# Patient Record
Sex: Male | Born: 1945 | Race: White | Hispanic: No | Marital: Married | Smoking: Former smoker
Health system: Southern US, Community
[De-identification: ages and names within clinical notes are randomized; demographics above are authoritative.]

## PROBLEM LIST (undated history)

## (undated) DIAGNOSIS — Z87442 Personal history of urinary calculi: Secondary | ICD-10-CM

## (undated) DIAGNOSIS — E538 Deficiency of other specified B group vitamins: Secondary | ICD-10-CM

## (undated) DIAGNOSIS — E119 Type 2 diabetes mellitus without complications: Secondary | ICD-10-CM

## (undated) DIAGNOSIS — I10 Essential (primary) hypertension: Secondary | ICD-10-CM

## (undated) DIAGNOSIS — M1990 Unspecified osteoarthritis, unspecified site: Secondary | ICD-10-CM

## (undated) DIAGNOSIS — E785 Hyperlipidemia, unspecified: Secondary | ICD-10-CM

## (undated) DIAGNOSIS — K219 Gastro-esophageal reflux disease without esophagitis: Secondary | ICD-10-CM

## (undated) DIAGNOSIS — Z8719 Personal history of other diseases of the digestive system: Secondary | ICD-10-CM

## (undated) DIAGNOSIS — C801 Malignant (primary) neoplasm, unspecified: Secondary | ICD-10-CM

## (undated) DIAGNOSIS — G4730 Sleep apnea, unspecified: Secondary | ICD-10-CM

## (undated) DIAGNOSIS — IMO0001 Reserved for inherently not codable concepts without codable children: Secondary | ICD-10-CM

## (undated) HISTORY — DX: Malignant (primary) neoplasm, unspecified: C80.1

## (undated) HISTORY — DX: Deficiency of other specified B group vitamins: E53.8

## (undated) HISTORY — DX: Essential (primary) hypertension: I10

## (undated) HISTORY — DX: Unspecified osteoarthritis, unspecified site: M19.90

## (undated) HISTORY — DX: Hyperlipidemia, unspecified: E78.5

## (undated) HISTORY — PX: LAPAROSCOPIC GASTRIC BANDING: SHX1100

## (undated) HISTORY — DX: Type 2 diabetes mellitus without complications: E11.9

## (undated) HISTORY — DX: Morbid (severe) obesity due to excess calories: E66.01

## (undated) HISTORY — PX: CATARACT EXTRACTION W/ INTRAOCULAR LENS  IMPLANT, BILATERAL: SHX1307

---

## 2002-04-06 ENCOUNTER — Encounter (INDEPENDENT_AMBULATORY_CARE_PROVIDER_SITE_OTHER): Payer: Self-pay | Admitting: *Deleted

## 2002-04-06 ENCOUNTER — Ambulatory Visit (HOSPITAL_COMMUNITY): Admission: RE | Admit: 2002-04-06 | Discharge: 2002-04-06 | Payer: Self-pay | Admitting: Gastroenterology

## 2005-04-05 ENCOUNTER — Ambulatory Visit: Payer: Self-pay | Admitting: Critical Care Medicine

## 2005-05-17 ENCOUNTER — Ambulatory Visit: Payer: Self-pay | Admitting: Critical Care Medicine

## 2007-02-20 LAB — HM COLONOSCOPY: HM COLON: NORMAL

## 2009-10-06 ENCOUNTER — Ambulatory Visit (HOSPITAL_COMMUNITY): Admission: RE | Admit: 2009-10-06 | Discharge: 2009-10-06 | Payer: Self-pay | Admitting: Surgery

## 2009-10-09 ENCOUNTER — Encounter: Admission: RE | Admit: 2009-10-09 | Discharge: 2009-12-10 | Payer: Self-pay | Admitting: Surgery

## 2009-10-17 ENCOUNTER — Ambulatory Visit (HOSPITAL_COMMUNITY): Admission: RE | Admit: 2009-10-17 | Discharge: 2009-10-17 | Payer: Self-pay | Admitting: Surgery

## 2010-01-15 ENCOUNTER — Encounter: Admission: RE | Admit: 2010-01-15 | Discharge: 2010-02-23 | Payer: Self-pay | Admitting: Surgery

## 2010-01-27 ENCOUNTER — Ambulatory Visit (HOSPITAL_COMMUNITY): Admission: RE | Admit: 2010-01-27 | Discharge: 2010-01-28 | Payer: Self-pay | Admitting: Surgery

## 2010-04-15 ENCOUNTER — Encounter: Admission: RE | Admit: 2010-04-15 | Discharge: 2010-04-15 | Payer: Self-pay | Admitting: Surgery

## 2010-07-24 ENCOUNTER — Encounter: Admission: RE | Admit: 2010-07-24 | Discharge: 2010-07-24 | Payer: Self-pay | Admitting: Surgery

## 2010-10-19 ENCOUNTER — Encounter
Admission: RE | Admit: 2010-10-19 | Discharge: 2010-12-22 | Payer: Self-pay | Source: Home / Self Care | Attending: Surgery | Admitting: Surgery

## 2011-01-13 ENCOUNTER — Other Ambulatory Visit: Payer: Self-pay | Admitting: Dermatology

## 2011-03-04 LAB — CBC
HCT: 41 % (ref 39.0–52.0)
Hemoglobin: 13.6 g/dL (ref 13.0–17.0)
MCV: 98.4 fL (ref 78.0–100.0)
MCV: 99.3 fL (ref 78.0–100.0)
Platelets: 131 10*3/uL — ABNORMAL LOW (ref 150–400)
Platelets: 183 10*3/uL (ref 150–400)
RBC: 3.95 MIL/uL — ABNORMAL LOW (ref 4.22–5.81)
RDW: 13.7 % (ref 11.5–15.5)

## 2011-03-04 LAB — DIFFERENTIAL
Basophils Absolute: 0 10*3/uL (ref 0.0–0.1)
Basophils Relative: 0 % (ref 0–1)
Basophils Relative: 1 % (ref 0–1)
Eosinophils Absolute: 0.1 10*3/uL (ref 0.0–0.7)
Eosinophils Absolute: 0.2 10*3/uL (ref 0.0–0.7)
Eosinophils Relative: 2 % (ref 0–5)
Lymphs Abs: 1.2 10*3/uL (ref 0.7–4.0)
Monocytes Absolute: 0.5 10*3/uL (ref 0.1–1.0)
Monocytes Absolute: 0.8 10*3/uL (ref 0.1–1.0)
Monocytes Relative: 8 % (ref 3–12)
Neutro Abs: 2.4 10*3/uL (ref 1.7–7.7)
Neutrophils Relative %: 52 % (ref 43–77)

## 2011-03-04 LAB — GLUCOSE, CAPILLARY
Glucose-Capillary: 111 mg/dL — ABNORMAL HIGH (ref 70–99)
Glucose-Capillary: 149 mg/dL — ABNORMAL HIGH (ref 70–99)
Glucose-Capillary: 152 mg/dL — ABNORMAL HIGH (ref 70–99)

## 2011-03-04 LAB — COMPREHENSIVE METABOLIC PANEL
ALT: 60 U/L — ABNORMAL HIGH (ref 0–53)
AST: 51 U/L — ABNORMAL HIGH (ref 0–37)
Albumin: 4.1 g/dL (ref 3.5–5.2)
CO2: 26 mEq/L (ref 19–32)
Chloride: 102 mEq/L (ref 96–112)
GFR calc non Af Amer: >60 mL/min (ref >60)
Total Protein: 7.3 g/dL (ref 6.0–8.3)

## 2011-04-27 ENCOUNTER — Encounter: Admit: 2011-04-27 | Payer: Self-pay | Admitting: Surgery

## 2011-04-27 ENCOUNTER — Encounter: Payer: BC Managed Care – PPO | Attending: Surgery | Admitting: *Deleted

## 2011-04-27 DIAGNOSIS — Z713 Dietary counseling and surveillance: Secondary | ICD-10-CM | POA: Insufficient documentation

## 2011-04-27 DIAGNOSIS — Z09 Encounter for follow-up examination after completed treatment for conditions other than malignant neoplasm: Secondary | ICD-10-CM | POA: Insufficient documentation

## 2011-04-27 DIAGNOSIS — Z9884 Bariatric surgery status: Secondary | ICD-10-CM | POA: Insufficient documentation

## 2011-04-30 NOTE — Op Note (Signed)
[place]  Patient:    John Bonilla, John Bonilla Visit Number: [id number] MRN: [id number]          Service Type: END Location: ENDO Attending Physician:  Orland Mustard Dictated by:   Llana Aliment. Randa Evens, M.D. Proc. Date: 04/06/02 Admit Date:  04/06/2002   CC:         [facility] [address], [place], [place] [address]                           Operative Report  DATE OF BIRTH:  Jun 09, 1946  PROCEDURE:  Colonoscopy and coagulation of polyps.  SURGEON:  James L. Edwards, M.D.  MEDICATIONS:  Fentanyl 100 mcg, Versed 10 mg IV.  SCOPE:  Olympus pediatric video colonoscope.  INDICATIONS:  Colon cancer screening.  DESCRIPTION OF PROCEDURE:  The procedure had been explained to the patient and consent obtained.  The patient was placed in the left lateral decubitus position.  The Olympus pediatric video colonoscope was inserted and advanced under direct visualization.  The prep was excellent.  We were able to reach the cecum without difficulty.  The scope was withdrawn and the cecum, ascending colon, hepatic flexure, transverse colon, splenic flexure, descending, and sigmoid colon were seen well upon removal.  No polyps or other lesions were seen.  The scope was withdrawn down in the rectum.  In the rectosigmoid area, three or four small sessile polyps were encountered.  They were cauterized and placed in a single jar.  The scope was withdrawn.  The patient tolerated the procedure well.  ASSESSMENT:  Small rectosigmoid polyps, cauterized.  PLAN:  Will recommend repeat colonoscopy if these are adenomatous.  We will await the pathology report to return before making this decision. Dictated by:   Llana Aliment. Randa Evens, M.D. Attending Physician:  Orland Mustard DD:  04/06/02 TD:  04/06/02 Job: [id number]

## 2012-09-01 ENCOUNTER — Telehealth (INDEPENDENT_AMBULATORY_CARE_PROVIDER_SITE_OTHER): Payer: Self-pay | Admitting: Surgery

## 2012-09-01 NOTE — Telephone Encounter (Signed)
I spoke with Verlon Au concerning the need for a follow-up visit for lap band surgery. The patient asked that I schedule an appt for his wife and himself on the same date/time frame. I scheduled his appt on 10/11/12 @ 2:40 pm....cef

## 2012-10-11 ENCOUNTER — Encounter (INDEPENDENT_AMBULATORY_CARE_PROVIDER_SITE_OTHER): Payer: Self-pay | Admitting: Surgery

## 2012-11-24 ENCOUNTER — Encounter (INDEPENDENT_AMBULATORY_CARE_PROVIDER_SITE_OTHER): Payer: Self-pay | Admitting: Surgery

## 2012-11-24 ENCOUNTER — Ambulatory Visit (INDEPENDENT_AMBULATORY_CARE_PROVIDER_SITE_OTHER): Payer: BC Managed Care – PPO | Admitting: Surgery

## 2012-11-24 DIAGNOSIS — Z9884 Bariatric surgery status: Secondary | ICD-10-CM

## 2012-11-24 NOTE — Patient Instructions (Signed)
Thanks for your patience.  If you need further assistance after leaving the office, please call our office and speak with a [facility] nurse.  [phone].  If you want to leave a message for Dr. Demetrica Zipp, please call his office phone at [phone]. 

## 2012-11-24 NOTE — Progress Notes (Signed)
Mr. And Mrs. Lemmons came in today for a lap band filled. I have not seen them in many years. In discussing their symptoms today are both restricted times and will spit up.  We reviewed times with most tightness in the morning. We then reviewed what they are eating. It is apparent that they are maladaptive eating. I explained how this can occur. I don't think that they need a filled today but I would not remove fluid either. I think they need an appointment with Sarah to review what they are reading. I have gone over the rationale for a low glycemic index food diet or high-protein diet. I would like to see him in 3 months and see how well third doing. Since Mr. Lemons just retired hopefully they can get to the [employer] and gets some exercise. Mrs. Lemmons has had bilateral knee replacements.  We had about a 30 minute talk and was utilized of information. I hope they are energized and will look at their dietary issues.    

## 2012-12-20 ENCOUNTER — Encounter: Payer: Self-pay | Admitting: *Deleted

## 2012-12-20 ENCOUNTER — Encounter: Payer: Medicare Other | Attending: Surgery | Admitting: *Deleted

## 2012-12-20 VITALS — Ht 69.0 in | Wt 283.1 lb

## 2012-12-20 DIAGNOSIS — Z9884 Bariatric surgery status: Secondary | ICD-10-CM | POA: Insufficient documentation

## 2012-12-20 DIAGNOSIS — E669 Obesity, unspecified: Secondary | ICD-10-CM

## 2012-12-20 DIAGNOSIS — Z09 Encounter for follow-up examination after completed treatment for conditions other than malignant neoplasm: Secondary | ICD-10-CM | POA: Insufficient documentation

## 2012-12-20 DIAGNOSIS — Z713 Dietary counseling and surveillance: Secondary | ICD-10-CM | POA: Insufficient documentation

## 2012-12-20 NOTE — Progress Notes (Signed)
  Follow-up visit:  ~ 3 Years Post-Operative LAGB Surgery  Medical Nutrition Therapy:  Appt start time: 0945 end time:  1045.  Primary concerns today: Post-operative Bariatric Surgery Nutrition Management. 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 Reports decreased stress now as he has retired from his job and wife's health has improved. Plans to eat out less and more at home. Reports he and his wife ate excessive CHO intake over the last few years.   Surgery type:  LAGB Surgery date:  01/27/2010 Start weight @ [facility]: 310.3 lbs Last weight: 254.1 lbs (04/27/11)   Weight today: 283.1 lbs Total weight lost: 27.2 lbs Goal weight: 200 lbs % goal met:  25%  24-hr recall: B (AM): 1 egg & 2 pcs regular bacon Snk (AM): NONE  L (PM): Ham & bean soup; baby sips of sweet tea (previously all fast food) Snk (PM): NONE  D (PM): "The Big Meal" - 1/3 chicken biscuit box - chicken, gravy, potatoes, biscuit all mixed together; baby sips of sweet tea Snk (PM): Ice cream or 1/2 bag kettle chips  Fluid intake: < 64 oz Estimated total protein intake: 60-70g  Medications: See medication list.  Supplementation: No calcium d/t h/o kidney stones. Advised to contact MD about resuming.  CBG monitoring: None Average CBG per patient: N/A Last patient reported A1c: Unknown  Using straws: No Drinking while eating: Takes baby sips with meals Hair loss: No Carbonated beverages: Rare N/V/D/C: No Last Lap-Band fill:  Feels in the green zone; likely d/t large food boluses  Recent physical activity:  None at this time d/t knee pain.  Plans to push mow his large yard and clear trees this spring/summer. Also plans to take advantage of Silver Sneakers program at [facility] and water walking.  Progress Towards Goal(s):  In progress.  Handouts given during visit include:  Bariatric Surgery Modified Post-Op Diet   Nutritional Diagnosis:  [place]-3.3 Morbid obesity related to poor  food choices and sedentary lifestyle s/p LAGB surgery as evidenced by patient-reported food recall and weight gain of ~30 lbs in last 1.5 years.    Intervention:  Nutrition education.  Monitoring/Evaluation:  Dietary intake, exercise, lap band fills, and body weight. Follow up in 6 weeks.

## 2012-12-20 NOTE — Patient Instructions (Addendum)
Goals:  Increase lean protein foods to meet 80g goal  Increase fluid intake to 64oz +  Avoid drinking 15 minutes before, during and 30 minutes after eating  Aim for >30 min of physical activity daily  Contact MD about resuming calcium citrate (min of 1000 mg daily)   Avoid fried and breaded foods and sweets:   Try Special K High Protein cereal

## 2012-12-22 LAB — HM COLONOSCOPY

## 2013-01-31 ENCOUNTER — Encounter: Payer: Medicare Other | Attending: Surgery | Admitting: *Deleted

## 2013-01-31 ENCOUNTER — Encounter: Payer: Self-pay | Admitting: *Deleted

## 2013-01-31 DIAGNOSIS — Z09 Encounter for follow-up examination after completed treatment for conditions other than malignant neoplasm: Secondary | ICD-10-CM | POA: Insufficient documentation

## 2013-01-31 DIAGNOSIS — Z9884 Bariatric surgery status: Secondary | ICD-10-CM | POA: Insufficient documentation

## 2013-01-31 DIAGNOSIS — Z713 Dietary counseling and surveillance: Secondary | ICD-10-CM | POA: Insufficient documentation

## 2013-01-31 NOTE — Progress Notes (Signed)
  Follow-up visit:  ~ 3 Years Post-Operative LAGB Surgery  Medical Nutrition Therapy:  Appt start time: 0830   End time:  915.  Primary concerns today: Post-operative Bariatric Surgery Nutrition Management. 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 Doing very well and reports watching CHO a lot more and continues to work on increasing exercise.   Surgery type:  LAGB Surgery date:  01/27/2010 Start weight @ [facility]: 310.3 lbs Last weight: 254.1 lbs (04/27/11)   Weight today: 278.0 lbs Weight change: 5.1 lbs Total weight lost: 32.3 lbs BMI: 41.1 kg/m^2  Goal weight: 200 lbs  24-hr recall: B (AM): 1 egg & 3 pcs regular bacon Snk (AM): NONE  L (PM): 1 cup ham & bean soup Snk (PM): NONE  D (PM): "The Big Meal" - double whopper with only 1/2 bun, 3-4 FF; diet coke (drank over several hours) Snk (PM): [employer] protein bar   Fluid intake: < 64 oz Estimated total protein intake: 60-70g  Medications: See medication list.  Supplementation: Taking MVI.  Still no calcium d/t h/o kidney stones. Advised to contact MD about resuming.  CBG monitoring: None Average CBG per patient: N/A Last patient reported A1c:  Done 12/13 though patient cannot remember  Using straws: No Drinking while eating: YES; Takes baby sips with meals Hair loss: No Carbonated beverages: Rare N/V/D/C: Regurgitation 4-5 times since last visit d/t eating too quickly Last Lap-Band fill:  Feels in the green zone  Recent physical activity:  None at this time d/t knee pain.  Plans to push mow his large yard and clear trees this spring/summer. Also plans to take advantage of Silver Sneakers program at [facility] and water walking.  Progress Towards Goal(s):  In progress.   Nutritional Diagnosis:  [place]-3.3 Morbid obesity related to poor food choices and sedentary lifestyle s/p LAGB surgery as evidenced by patient-reported food recall and weight gain of ~30 lbs in last 1.5 years.    Intervention:   Nutrition education.  Monitoring/Evaluation:  Dietary intake, exercise, lap band fills, and body weight. Follow up in 6 weeks.

## 2013-01-31 NOTE — Patient Instructions (Addendum)
Goals:  Increase lean protein foods to meet 80g goal  Increase fluid intake to 64oz +  Avoid drinking 15 minutes before, during and 30 minutes after eating  Aim for >30 min of physical activity daily  Contact MD about resuming calcium citrate (min of 1000 mg daily)   Avoid fried and breaded foods and sweets  Update me monthly via email on your success. :)

## 2013-03-14 ENCOUNTER — Ambulatory Visit: Payer: BC Managed Care – PPO | Admitting: *Deleted

## 2013-04-23 ENCOUNTER — Ambulatory Visit: Payer: BC Managed Care – PPO | Admitting: *Deleted

## 2013-07-23 ENCOUNTER — Other Ambulatory Visit: Payer: Self-pay | Admitting: Sports Medicine

## 2013-07-23 DIAGNOSIS — M5410 Radiculopathy, site unspecified: Secondary | ICD-10-CM

## 2013-07-23 DIAGNOSIS — M542 Cervicalgia: Secondary | ICD-10-CM

## 2013-07-30 ENCOUNTER — Ambulatory Visit
Admission: RE | Admit: 2013-07-30 | Discharge: 2013-07-30 | Disposition: A | Discharge status: Home / Self Care | Payer: Medicare Other | Source: Ambulatory Visit | Attending: Sports Medicine | Admitting: Sports Medicine

## 2013-07-30 ENCOUNTER — Other Ambulatory Visit: Payer: Self-pay | Admitting: Sports Medicine

## 2013-07-30 DIAGNOSIS — M5410 Radiculopathy, site unspecified: Secondary | ICD-10-CM

## 2013-07-30 DIAGNOSIS — Z77018 Contact with and (suspected) exposure to other hazardous metals: Secondary | ICD-10-CM

## 2013-07-30 DIAGNOSIS — M542 Cervicalgia: Secondary | ICD-10-CM

## 2013-11-06 ENCOUNTER — Other Ambulatory Visit: Payer: Self-pay | Admitting: Dermatology

## 2014-02-25 ENCOUNTER — Other Ambulatory Visit: Payer: Self-pay | Admitting: Orthopedic Surgery

## 2014-02-25 DIAGNOSIS — M545 Low back pain, unspecified: Secondary | ICD-10-CM

## 2014-02-26 ENCOUNTER — Other Ambulatory Visit: Payer: Medicare Other

## 2014-03-11 ENCOUNTER — Ambulatory Visit
Admission: RE | Admit: 2014-03-11 | Discharge: 2014-03-11 | Disposition: A | Discharge status: Home / Self Care | Payer: Commercial Managed Care - HMO | Source: Ambulatory Visit | Attending: Orthopedic Surgery | Admitting: Orthopedic Surgery

## 2014-03-11 DIAGNOSIS — M545 Low back pain, unspecified: Secondary | ICD-10-CM

## 2014-05-20 ENCOUNTER — Emergency Department (HOSPITAL_COMMUNITY)
Admission: EM | Admit: 2014-05-20 | Discharge: 2014-05-20 | Disposition: A | Discharge status: Home / Self Care | Payer: Medicare HMO | Attending: Emergency Medicine | Admitting: Emergency Medicine

## 2014-05-20 ENCOUNTER — Emergency Department (HOSPITAL_COMMUNITY): Payer: Medicare HMO

## 2014-05-20 ENCOUNTER — Encounter (HOSPITAL_COMMUNITY): Payer: Self-pay | Admitting: Emergency Medicine

## 2014-05-20 DIAGNOSIS — Z79899 Other long term (current) drug therapy: Secondary | ICD-10-CM | POA: Insufficient documentation

## 2014-05-20 DIAGNOSIS — Z859 Personal history of malignant neoplasm, unspecified: Secondary | ICD-10-CM | POA: Insufficient documentation

## 2014-05-20 DIAGNOSIS — R51 Headache: Secondary | ICD-10-CM | POA: Insufficient documentation

## 2014-05-20 DIAGNOSIS — I10 Essential (primary) hypertension: Secondary | ICD-10-CM | POA: Insufficient documentation

## 2014-05-20 DIAGNOSIS — R5381 Other malaise: Secondary | ICD-10-CM | POA: Insufficient documentation

## 2014-05-20 DIAGNOSIS — E785 Hyperlipidemia, unspecified: Secondary | ICD-10-CM | POA: Insufficient documentation

## 2014-05-20 DIAGNOSIS — M129 Arthropathy, unspecified: Secondary | ICD-10-CM | POA: Insufficient documentation

## 2014-05-20 DIAGNOSIS — R5383 Other fatigue: Secondary | ICD-10-CM

## 2014-05-20 DIAGNOSIS — R519 Headache, unspecified: Secondary | ICD-10-CM

## 2014-05-20 DIAGNOSIS — R0602 Shortness of breath: Secondary | ICD-10-CM | POA: Insufficient documentation

## 2014-05-20 DIAGNOSIS — R079 Chest pain, unspecified: Secondary | ICD-10-CM | POA: Insufficient documentation

## 2014-05-20 DIAGNOSIS — Z7982 Long term (current) use of aspirin: Secondary | ICD-10-CM | POA: Insufficient documentation

## 2014-05-20 DIAGNOSIS — E119 Type 2 diabetes mellitus without complications: Secondary | ICD-10-CM | POA: Insufficient documentation

## 2014-05-20 DIAGNOSIS — E669 Obesity, unspecified: Secondary | ICD-10-CM | POA: Insufficient documentation

## 2014-05-20 LAB — URINALYSIS, ROUTINE W REFLEX MICROSCOPIC
BILIRUBIN URINE: NEGATIVE
Glucose, UA: >1000 mg/dL — AB
Hgb urine dipstick: NEGATIVE
Ketones, ur: NEGATIVE mg/dL
Leukocytes, UA: NEGATIVE
Nitrite: NEGATIVE
PH: 6 (ref 5.0–8.0)
Protein, ur: NEGATIVE mg/dL
SPECIFIC GRAVITY, URINE: 1.021 (ref 1.005–1.030)
Urobilinogen, UA: 1 mg/dL (ref 0.0–1.0)

## 2014-05-20 LAB — CBC
HEMATOCRIT: 40.2 % (ref 39.0–52.0)
HEMOGLOBIN: 14.1 g/dL (ref 13.0–17.0)
MCH: 33.7 pg (ref 26.0–34.0)
MCHC: 35.1 g/dL (ref 30.0–36.0)
MCV: 96.2 fL (ref 78.0–100.0)
Platelets: 232 10*3/uL (ref 150–400)
RBC: 4.18 MIL/uL — ABNORMAL LOW (ref 4.22–5.81)
RDW: 12.9 % (ref 11.5–15.5)
WBC: 8 10*3/uL (ref 4.0–10.5)

## 2014-05-20 LAB — URINE MICROSCOPIC-ADD ON

## 2014-05-20 LAB — BASIC METABOLIC PANEL
BUN: 18 mg/dL (ref 6–23)
CALCIUM: 9.6 mg/dL (ref 8.4–10.5)
CO2: 26 meq/L (ref 19–32)
Chloride: 100 mEq/L (ref 96–112)
Creatinine, Ser: 0.67 mg/dL (ref 0.50–1.35)
GFR calc Af Amer: >90 mL/min (ref >90)
GLUCOSE: 213 mg/dL — AB (ref 70–99)
POTASSIUM: 3.9 meq/L (ref 3.7–5.3)
Sodium: 140 mEq/L (ref 137–147)

## 2014-05-20 LAB — I-STAT TROPONIN, ED: Troponin i, poc: 0 ng/mL (ref 0.00–0.08)

## 2014-05-20 LAB — PRO B NATRIURETIC PEPTIDE: Pro B Natriuretic peptide (BNP): 17.8 pg/mL (ref 0–125)

## 2014-05-20 NOTE — ED Provider Notes (Signed)
ECG interpretation   Date: 05/20/2014  Rate: 63  Rhythm: normal sinus rhythm  QRS Axis: normal  Intervals: normal  ST/T Wave abnormalities: normal  Conduction Disutrbances: none  Narrative Interpretation:   Old EKG Reviewed: No significant changes noted     Hoy Morn, MD 05/20/14 [phone]

## 2014-05-20 NOTE — ED Notes (Signed)
MD at bedside. Dr. Campos. 

## 2014-05-20 NOTE — ED Notes (Signed)
Pt presents via [facility] from [facility] for symptoms of weakness, lightheaded, poor balance, chest pain, dyspnea, and right head pain. Onset was Friday. Pt measured his BP throughout the weekend. BP remained high.

## 2014-05-20 NOTE — ED Notes (Signed)
Patient transported to CT 

## 2014-05-20 NOTE — Discharge Instructions (Signed)
Chest Pain (Nonspecific) °It is often hard to give a specific diagnosis for the cause of chest pain. There is always a chance that your pain could be related to something serious, such as a heart attack or a blood clot in the lungs. You need to follow up with your caregiver for further evaluation. °CAUSES  °· Heartburn. °· Pneumonia or bronchitis. °· Anxiety or stress. °· Inflammation around your heart (pericarditis) or lung (pleuritis or pleurisy). °· A blood clot in the lung. °· A collapsed lung (pneumothorax). It can develop suddenly on its own (spontaneous pneumothorax) or from injury (trauma) to the chest. °· Shingles infection (herpes zoster virus). °The chest wall is composed of bones, muscles, and cartilage. Any of these can be the source of the pain. °· The bones can be bruised by injury. °· The muscles or cartilage can be strained by coughing or overwork. °· The cartilage can be affected by inflammation and become sore (costochondritis). °DIAGNOSIS  °Lab tests or other studies, such as X-rays, electrocardiography, stress testing, or cardiac imaging, may be needed to find the cause of your pain.  °TREATMENT  °· Treatment depends on what may be causing your chest pain. Treatment may include: °· Acid blockers for heartburn. °· Anti-inflammatory medicine. °· Pain medicine for inflammatory conditions. °· Antibiotics if an infection is present. °· You may be advised to change lifestyle habits. This includes stopping smoking and avoiding alcohol, caffeine, and chocolate. °· You may be advised to keep your head raised (elevated) when sleeping. This reduces the chance of acid going backward from your stomach into your esophagus. °· Most of the time, nonspecific chest pain will improve within 2 to 3 days with rest and mild pain medicine. °HOME CARE INSTRUCTIONS  °· If antibiotics were prescribed, take your antibiotics as directed. Finish them even if you start to feel better. °· For the next few days, avoid physical  activities that bring on chest pain. Continue physical activities as directed. °· Do not smoke. °· Avoid drinking alcohol. °· Only take over-the-counter or prescription medicine for pain, discomfort, or fever as directed by your caregiver. °· Follow your caregiver's suggestions for further testing if your chest pain does not go away. °· Keep any follow-up appointments you made. If you do not go to an appointment, you could develop lasting (chronic) problems with pain. If there is any problem keeping an appointment, you must call to reschedule. °SEEK MEDICAL CARE IF:  °· You think you are having problems from the medicine you are taking. Read your medicine instructions carefully. °· Your chest pain does not go away, even after treatment. °· You develop a rash with blisters on your chest. °SEEK IMMEDIATE MEDICAL CARE IF:  °· You have increased chest pain or pain that spreads to your arm, neck, jaw, back, or abdomen. °· You develop shortness of breath, an increasing cough, or you are coughing up blood. °· You have severe back or abdominal pain, feel nauseous, or vomit. °· You develop severe weakness, fainting, or chills. °· You have a fever. °THIS IS AN EMERGENCY. Do not wait to see if the pain will go away. Get medical help at once. Call your local emergency services (911 in [place] Do not drive yourself to the hospital. °MAKE SURE YOU:  °· Understand these instructions. °· Will watch your condition. °· Will get help right away if you are not doing well or get worse. °Document Released: 09/08/2005 Document Revised: 02/21/2012 Document Reviewed: 07/04/2008 °ExitCare® Patient Information ©2014 [employer]   LL[employer]. ° °

## 2014-05-21 NOTE — ED Provider Notes (Signed)
CSN: [id number]     Arrival date & time 05/20/14  1050 History   First MD Initiated Contact with Patient 05/20/14 1058     Chief Complaint  Patient presents with  . Chest Pain      HPI Patient presents the emergency department with complaints of weakness, and sharp right sided headache.  He reports occasional chest pain and some occasional shortness of breath.  He has a history of diabetes, hypertension, hyperlipidemia.  No history of coronary artery disease.  He states the majority of his pain is located in his right head and the sharp in nature.  He previously had migraine headaches but states this feels different.  His denies weakness of his arms or legs.  He is ports the chest discomfort is transient and lasts for seconds to minutes.  It is not always associated with shortness of breath.  He did 80 and drinking well.  He denies fevers but did have some chills for the weekend.  He also reports that he continue to check his blood pressure now is elevated and he was concerned that his issues are coming from elevated blood pressure.  She's continues to be compliant with his blood pressure medications.  Patient is obese has had no significant weight changes recently.  He has a prior history of gastric banding.  Denies abdominal discomfort.  No nausea or vomiting.  Denies melena or hematochezia.  Family reports no confusion.  Wife reports that overall he looks okay to her.  Past Medical History  Diagnosis Date  . Arthritis   . Cancer   . Diabetes mellitus without complication   . Hyperlipidemia   . Hypertension   . Morbid obesity    Past Surgical History  Procedure Laterality Date  . Laparoscopic gastric banding     No family history on file. History  Substance Use Topics  . Smoking status: Never Smoker   . Smokeless tobacco: Never Used  . Alcohol Use: No    Review of Systems  All other systems reviewed and are negative.     Allergies  Review of patient's allergies indicates no  known allergies.  Home Medications   Prior to Admission medications   Medication Sig Start Date End Date Taking? Authorizing Provider  aspirin 81 MG tablet Take 81 mg by mouth daily.   Yes Historical Provider, MD  cetirizine (ZYRTEC) 10 MG tablet Take 10 mg by mouth daily.   Yes Historical Provider, MD  diclofenac sodium (VOLTAREN) 1 % GEL Apply 2 g topically daily as needed (pain).    Yes Historical Provider, MD  hydrochlorothiazide (MICROZIDE) 12.5 MG capsule Take 12.5 mg by mouth daily.   Yes Historical Provider, MD  metFORMIN (GLUCOPHAGE) 500 MG tablet Take 500 mg by mouth 2 (two) times daily with a meal.    Yes Historical Provider, MD  Multiple Vitamin (MULTIVITAMIN WITH MINERALS) TABS tablet Take 1 tablet by mouth daily.   Yes Historical Provider, MD  pravastatin (PRAVACHOL) 40 MG tablet Take 40 mg by mouth daily.   Yes Historical Provider, MD  quinapril (ACCUPRIL) 40 MG tablet Take 40 mg by mouth daily.    Yes Historical Provider, MD  vitamin E 400 UNIT capsule Take 400 Units by mouth daily.   Yes Historical Provider, MD   BP 139/83  Pulse 64  Temp(Src) 99.1 F (37.3 C) (Oral)  Resp 20  Ht 5\' 10"  (1.778 m)  Wt 276 lb (125.193 kg)  BMI 39.60 kg/m2  SpO2 97% Physical Exam  Nursing note and vitals reviewed. Constitutional: He is oriented to person, place, and time. He appears well-developed and well-nourished.  HENT:  Head: Normocephalic and atraumatic.  Eyes: EOM are normal. Pupils are equal, round, and reactive to light.  Neck: Normal range of motion.  Cardiovascular: Normal rate, regular rhythm, normal heart sounds and intact distal pulses.   Pulmonary/Chest: Effort normal and breath sounds normal. No respiratory distress.  Abdominal: Soft. He exhibits no distension. There is no tenderness.  Musculoskeletal: Normal range of motion.  Neurological: He is alert and oriented to person, place, and time.  5/5 strength in major muscle groups of  bilateral upper and lower  extremities. Speech normal. No facial asymetry.   Skin: Skin is warm and dry.  Psychiatric: He has a normal mood and affect. Judgment normal.    ED Course  Procedures (including critical care time) Labs Review Labs Reviewed  CBC - Abnormal; Notable for the following:    RBC 4.18 (*)    All other components within normal limits  BASIC METABOLIC PANEL - Abnormal; Notable for the following:    Glucose, Bld 213 (*)    All other components within normal limits  URINALYSIS, ROUTINE W REFLEX MICROSCOPIC - Abnormal; Notable for the following:    Glucose, UA >1000 (*)    All other components within normal limits  PRO B NATRIURETIC PEPTIDE  URINE MICROSCOPIC-ADD ON  I-STAT TROPOININ, ED    Imaging Review Ct Head Wo Contrast  05/20/2014   CLINICAL DATA:  Headache with numbness and dizziness. Right-sided head pain with poor balance. History of hypertension.  EXAM: CT HEAD WITHOUT CONTRAST  TECHNIQUE: Contiguous axial images were obtained from the base of the skull through the vertex without intravenous contrast.  COMPARISON:  None.  FINDINGS: No intracranial hemorrhage.  No CT evidence of large acute infarct.  No intracranial mass lesion noted on this unenhanced exam.  No hydrocephalus.  Vascular calcifications.  Visualized sinuses and mastoid air cells are clear. Orbital structures grossly within normal limits.  IMPRESSION: No intracranial hemorrhage or CT evidence of large acute infarct.   Electronically Signed   By: Chauncey Cruel M.D.   On: 05/20/2014 14:34   Dg Chest Port 1 View  05/20/2014   CLINICAL DATA:  Chest pain  EXAM: PORTABLE CHEST - 1 VIEW  COMPARISON:  07/12/2011  FINDINGS: The calcified granuloma is again noted in the left mid lung. The heart and pulmonary vascularity are unremarkable. The lungs are clear. No sizable effusion or pneumothorax is noted. No acute bony abnormality is noted.  IMPRESSION: No acute abnormality seen.   Electronically Signed   By: Inez Catalina M.D.   On:  05/20/2014 11:55  I personally reviewed the imaging tests through PACS system I reviewed available ER/hospitalization records through the EMR   ECG interpretation (see separate note)  MDM   Final diagnoses:  Headache  Chest pain    I observed the patient in the emergency department for several hours.  He has a multitude of complaints and the constellation of symptoms cannot be attributed to just one issue.  My suspicion for ACS is low.  My suspicion for stroke is low.  No mass or bleed noted on head CT.  No active chest pain this time.  EKG and troponin are without abnormalities.  Laboratory studies and vital signs without significant abnormality.  I recommended that the patient continued to monitor her symptoms closely at home and followup with his primary care physician.  He and  his wife are encouraged to return to the emergency department for any new or worsening symptoms.  He did have an oral temperature 99.1 on arrival of emergency department.  I suspect much of this may be virally mediated.    Hoy Morn, MD 05/21/14 [phone]

## 2014-11-08 ENCOUNTER — Ambulatory Visit: Payer: Medicare Other | Admitting: Family Medicine

## 2014-12-17 DIAGNOSIS — M19041 Primary osteoarthritis, right hand: Secondary | ICD-10-CM | POA: Diagnosis not present

## 2014-12-17 DIAGNOSIS — M2550 Pain in unspecified joint: Secondary | ICD-10-CM | POA: Diagnosis not present

## 2014-12-17 DIAGNOSIS — M5136 Other intervertebral disc degeneration, lumbar region: Secondary | ICD-10-CM | POA: Diagnosis not present

## 2014-12-17 DIAGNOSIS — M5030 Other cervical disc degeneration, unspecified cervical region: Secondary | ICD-10-CM | POA: Diagnosis not present

## 2014-12-17 DIAGNOSIS — E790 Hyperuricemia without signs of inflammatory arthritis and tophaceous disease: Secondary | ICD-10-CM | POA: Diagnosis not present

## 2015-01-09 DIAGNOSIS — N138 Other obstructive and reflux uropathy: Secondary | ICD-10-CM | POA: Diagnosis not present

## 2015-01-09 DIAGNOSIS — C679 Malignant neoplasm of bladder, unspecified: Secondary | ICD-10-CM | POA: Diagnosis not present

## 2015-01-09 DIAGNOSIS — R300 Dysuria: Secondary | ICD-10-CM | POA: Diagnosis not present

## 2015-01-09 DIAGNOSIS — N401 Enlarged prostate with lower urinary tract symptoms: Secondary | ICD-10-CM | POA: Diagnosis not present

## 2015-01-10 DIAGNOSIS — M5416 Radiculopathy, lumbar region: Secondary | ICD-10-CM | POA: Diagnosis not present

## 2015-01-10 DIAGNOSIS — M5412 Radiculopathy, cervical region: Secondary | ICD-10-CM | POA: Diagnosis not present

## 2015-01-13 HISTORY — PX: CYSTOSCOPY: SUR368

## 2015-01-21 DIAGNOSIS — R312 Other microscopic hematuria: Secondary | ICD-10-CM | POA: Diagnosis not present

## 2015-01-21 DIAGNOSIS — C679 Malignant neoplasm of bladder, unspecified: Secondary | ICD-10-CM | POA: Diagnosis not present

## 2015-01-21 DIAGNOSIS — N359 Urethral stricture, unspecified: Secondary | ICD-10-CM | POA: Diagnosis not present

## 2015-01-21 DIAGNOSIS — K760 Fatty (change of) liver, not elsewhere classified: Secondary | ICD-10-CM | POA: Diagnosis not present

## 2015-01-21 DIAGNOSIS — N281 Cyst of kidney, acquired: Secondary | ICD-10-CM | POA: Diagnosis not present

## 2015-01-23 ENCOUNTER — Other Ambulatory Visit: Payer: Self-pay | Admitting: Orthopedic Surgery

## 2015-01-23 DIAGNOSIS — M542 Cervicalgia: Secondary | ICD-10-CM

## 2015-01-23 DIAGNOSIS — M5136 Other intervertebral disc degeneration, lumbar region: Secondary | ICD-10-CM | POA: Diagnosis not present

## 2015-01-28 DIAGNOSIS — E790 Hyperuricemia without signs of inflammatory arthritis and tophaceous disease: Secondary | ICD-10-CM | POA: Diagnosis not present

## 2015-01-28 DIAGNOSIS — M2550 Pain in unspecified joint: Secondary | ICD-10-CM | POA: Diagnosis not present

## 2015-02-06 ENCOUNTER — Ambulatory Visit
Admission: RE | Admit: 2015-02-06 | Discharge: 2015-02-06 | Disposition: A | Discharge status: Home / Self Care | Payer: Commercial Managed Care - HMO | Source: Ambulatory Visit | Attending: Orthopedic Surgery | Admitting: Orthopedic Surgery

## 2015-02-06 DIAGNOSIS — M542 Cervicalgia: Secondary | ICD-10-CM

## 2015-02-06 DIAGNOSIS — M5032 Other cervical disc degeneration, mid-cervical region: Secondary | ICD-10-CM | POA: Diagnosis not present

## 2015-02-06 DIAGNOSIS — M4802 Spinal stenosis, cervical region: Secondary | ICD-10-CM | POA: Diagnosis not present

## 2015-02-18 ENCOUNTER — Telehealth: Payer: Self-pay | Admitting: *Deleted

## 2015-02-18 ENCOUNTER — Encounter: Payer: Self-pay | Admitting: *Deleted

## 2015-02-18 DIAGNOSIS — M542 Cervicalgia: Secondary | ICD-10-CM | POA: Diagnosis not present

## 2015-02-18 DIAGNOSIS — M5136 Other intervertebral disc degeneration, lumbar region: Secondary | ICD-10-CM | POA: Diagnosis not present

## 2015-02-18 DIAGNOSIS — M545 Low back pain: Secondary | ICD-10-CM | POA: Diagnosis not present

## 2015-02-18 DIAGNOSIS — M5416 Radiculopathy, lumbar region: Secondary | ICD-10-CM | POA: Diagnosis not present

## 2015-02-18 NOTE — Telephone Encounter (Signed)
Patient unavailable at time of call.   Left message on voicemail.

## 2015-02-18 NOTE — Telephone Encounter (Signed)
Pre-Visit Call completed with patient and chart updated.   Pre-Visit Info documented in Specialty Comments under [employer].    

## 2015-02-20 ENCOUNTER — Ambulatory Visit (INDEPENDENT_AMBULATORY_CARE_PROVIDER_SITE_OTHER): Payer: Commercial Managed Care - HMO | Admitting: Family Medicine

## 2015-02-20 ENCOUNTER — Telehealth: Payer: Self-pay | Admitting: Family Medicine

## 2015-02-20 ENCOUNTER — Encounter: Payer: Self-pay | Admitting: Family Medicine

## 2015-02-20 VITALS — BP 122/80 | HR 80 | Temp 98.1°F | Resp 16 | Ht 69.75 in | Wt 283.4 lb

## 2015-02-20 DIAGNOSIS — I10 Essential (primary) hypertension: Secondary | ICD-10-CM | POA: Diagnosis not present

## 2015-02-20 DIAGNOSIS — E785 Hyperlipidemia, unspecified: Secondary | ICD-10-CM

## 2015-02-20 DIAGNOSIS — E1140 Type 2 diabetes mellitus with diabetic neuropathy, unspecified: Secondary | ICD-10-CM | POA: Diagnosis not present

## 2015-02-20 DIAGNOSIS — E1169 Type 2 diabetes mellitus with other specified complication: Secondary | ICD-10-CM | POA: Diagnosis not present

## 2015-02-20 LAB — CBC WITH DIFFERENTIAL/PLATELET
BASOS ABS: 0.1 10*3/uL (ref 0.0–0.1)
Basophils Relative: 0.7 % (ref 0.0–3.0)
Eosinophils Absolute: 0.2 10*3/uL (ref 0.0–0.7)
Eosinophils Relative: 2.9 % (ref 0.0–5.0)
HCT: 41.3 % (ref 39.0–52.0)
Hemoglobin: 14 g/dL (ref 13.0–17.0)
LYMPHS ABS: 1.4 10*3/uL (ref 0.7–4.0)
LYMPHS PCT: 18.8 % (ref 12.0–46.0)
MCHC: 34 g/dL (ref 30.0–36.0)
MCV: 100.3 fl — ABNORMAL HIGH (ref 78.0–100.0)
Monocytes Absolute: 0.7 10*3/uL (ref 0.1–1.0)
Monocytes Relative: 9.4 % (ref 3.0–12.0)
NEUTROS ABS: 5.2 10*3/uL (ref 1.4–7.7)
Neutrophils Relative %: 68.2 % (ref 43.0–77.0)
Platelets: 306 10*3/uL (ref 150.0–400.0)
RBC: 4.11 Mil/uL — AB (ref 4.22–5.81)
RDW: 14.8 % (ref 11.5–15.5)
WBC: 7.6 10*3/uL (ref 4.0–10.5)

## 2015-02-20 LAB — BASIC METABOLIC PANEL
BUN: 14 mg/dL (ref 6–23)
CALCIUM: 9.6 mg/dL (ref 8.4–10.5)
CO2: 31 mEq/L (ref 19–32)
CREATININE: 0.99 mg/dL (ref 0.40–1.50)
Chloride: 101 mEq/L (ref 96–112)
GFR: 79.79 mL/min (ref >60.00)
GLUCOSE: 156 mg/dL — AB (ref 70–99)
Potassium: 3.5 mEq/L (ref 3.5–5.1)
Sodium: 139 mEq/L (ref 135–145)

## 2015-02-20 LAB — HEPATIC FUNCTION PANEL
ALK PHOS: 47 U/L (ref 39–117)
ALT: 35 U/L (ref 0–53)
AST: 30 U/L (ref 0–37)
Albumin: 4.3 g/dL (ref 3.5–5.2)
Bilirubin, Direct: 0.2 mg/dL (ref 0.0–0.3)
Total Bilirubin: 0.5 mg/dL (ref 0.2–1.2)
Total Protein: 7.2 g/dL (ref 6.0–8.3)

## 2015-02-20 LAB — HEMOGLOBIN A1C: Hgb A1c MFr Bld: 7.1 % — ABNORMAL HIGH (ref 4.6–6.5)

## 2015-02-20 LAB — LIPID PANEL
Cholesterol: 130 mg/dL (ref 0–200)
HDL: 51.2 mg/dL (ref >39.00)
LDL Cholesterol: 64 mg/dL (ref 0–99)
NONHDL: 78.8
Total CHOL/HDL Ratio: 3
Triglycerides: 73 mg/dL (ref 0.0–149.0)
VLDL: 14.6 mg/dL (ref 0.0–40.0)

## 2015-02-20 NOTE — Assessment & Plan Note (Signed)
New to provider, ongoing for pt.  Well controlled.  Asymptomatic.  Check labs.  No anticipated med changes. 

## 2015-02-20 NOTE — Assessment & Plan Note (Signed)
Chronic problem.  Due for eye exam- referral entered.  On ACE for renal protection.  + neuropathy bilaterally.  Some symptomatic lows.  Check labs.  Adjust meds prn.  Discussed need for low carb diet and regular exercise.

## 2015-02-20 NOTE — Telephone Encounter (Signed)
Pt notified that I did not call him.

## 2015-02-20 NOTE — Progress Notes (Signed)
   Subjective:    Patient ID: John Bonilla, male    DOB: Jan 14, 1946, 69 y.o.   MRN: [id number]  HPI  New to establish.  Previous MD- Dr Rex Kras  GI- [facility]  Ophtho- Dr Tory Emerald- [place]  [facility]  DM- chronic problem, on Metformin, Amaryl.  Due for eye exam,  UTD on foot exam (Dec 2015).  On ACE for renal protection.  dx'd 5-6 yrs ago.  + numbness/tingling of feet.  Some symptomatic lows.  + vomiting- pt reports this is due to overeating in presence of lap band.  HTN- chronic problem, on HCTZ, Quinapril.  Good BP control.  No CP, SOB, HAs, visual changes.  Hyperlipidemia- chronic problem, on Pravastatin.  No abd pain, myalgias.   Review of Systems For ROS see HPI     Objective:   Physical Exam  Constitutional: He is oriented to person, place, and time. He appears well-developed and well-nourished. No distress.  HENT:  Head: Normocephalic and atraumatic.  Eyes: Conjunctivae and EOM are normal. Pupils are equal, round, and reactive to light.  Neck: Normal range of motion. Neck supple. No thyromegaly present.  Cardiovascular: Normal rate, regular rhythm, normal heart sounds and intact distal pulses.   No murmur heard. Pulmonary/Chest: Effort normal and breath sounds normal. No respiratory distress.  Abdominal: Soft. Bowel sounds are normal. He exhibits no distension.  Musculoskeletal: He exhibits no edema.  Lymphadenopathy:    He has no cervical adenopathy.  Neurological: He is alert and oriented to person, place, and time. No cranial nerve deficit.  Skin: Skin is warm and dry.  Psychiatric: He has a normal mood and affect. His behavior is normal.  Vitals reviewed.         Assessment & Plan:

## 2015-02-20 NOTE — Assessment & Plan Note (Signed)
Chronic problem.  Tolerating statin.  Check labs.  Adjust meds prn  

## 2015-02-20 NOTE — Telephone Encounter (Signed)
Caller name: Eliot, Popper Relation to pt: self  Call back number: [phone]   Reason for call:  Pt states Jessica left VM.

## 2015-02-20 NOTE — Patient Instructions (Signed)
Follow up in 3-4 months to recheck sugars We'll notify you of your lab results and make any changes if needed We'll call you with your eye exam Try and make healthy food choices and get regular exercise Call with any questions or concerns Welcome!  We're glad to have you!!!

## 2015-02-20 NOTE — Progress Notes (Signed)
Pre visit review using our clinic review tool, if applicable. No additional management support is needed unless otherwise documented below in the visit note. 

## 2015-02-21 ENCOUNTER — Encounter: Payer: Self-pay | Admitting: General Practice

## 2015-02-21 LAB — TSH: TSH: 1.11 u[IU]/mL (ref 0.35–4.50)

## 2015-02-27 DIAGNOSIS — G4733 Obstructive sleep apnea (adult) (pediatric): Secondary | ICD-10-CM | POA: Diagnosis not present

## 2015-02-27 DIAGNOSIS — G4730 Sleep apnea, unspecified: Secondary | ICD-10-CM | POA: Diagnosis not present

## 2015-02-28 DIAGNOSIS — M5136 Other intervertebral disc degeneration, lumbar region: Secondary | ICD-10-CM | POA: Diagnosis not present

## 2015-02-28 DIAGNOSIS — M5416 Radiculopathy, lumbar region: Secondary | ICD-10-CM | POA: Diagnosis not present

## 2015-03-04 DIAGNOSIS — C679 Malignant neoplasm of bladder, unspecified: Secondary | ICD-10-CM | POA: Diagnosis not present

## 2015-03-17 DIAGNOSIS — M2550 Pain in unspecified joint: Secondary | ICD-10-CM | POA: Diagnosis not present

## 2015-03-21 DIAGNOSIS — M542 Cervicalgia: Secondary | ICD-10-CM | POA: Diagnosis not present

## 2015-03-21 DIAGNOSIS — M5136 Other intervertebral disc degeneration, lumbar region: Secondary | ICD-10-CM | POA: Diagnosis not present

## 2015-03-24 LAB — BASIC METABOLIC PANEL
BUN: 14 mg/dL (ref 4–21)
Creatinine: 1 mg/dL (ref <=1.3)
GLUCOSE: 184 mg/dL
POTASSIUM: 3.6 mmol/L (ref 3.4–5.3)
Sodium: 138 mmol/L (ref 137–147)

## 2015-03-24 LAB — CBC AND DIFFERENTIAL
HCT: 42 % (ref 41–53)
HEMOGLOBIN: 14.2 g/dL (ref 13.5–17.5)
NEUTROS ABS: 69 /uL
Platelets: 254 10*3/uL (ref 150–399)
WBC: 7.7 10*3/mL

## 2015-03-24 LAB — HEPATIC FUNCTION PANEL
ALK PHOS: 54 U/L (ref 25–125)
ALT: 40 U/L (ref 10–40)
AST: 27 U/L (ref 14–40)

## 2015-03-28 ENCOUNTER — Encounter: Payer: Self-pay | Admitting: General Practice

## 2015-03-31 DIAGNOSIS — M2550 Pain in unspecified joint: Secondary | ICD-10-CM | POA: Diagnosis not present

## 2015-03-31 DIAGNOSIS — E790 Hyperuricemia without signs of inflammatory arthritis and tophaceous disease: Secondary | ICD-10-CM | POA: Diagnosis not present

## 2015-03-31 DIAGNOSIS — M1A09X1 Idiopathic chronic gout, multiple sites, with tophus (tophi): Secondary | ICD-10-CM | POA: Diagnosis not present

## 2015-03-31 DIAGNOSIS — M109 Gout, unspecified: Secondary | ICD-10-CM | POA: Diagnosis not present

## 2015-04-04 ENCOUNTER — Telehealth: Payer: Self-pay | Admitting: Family Medicine

## 2015-04-04 DIAGNOSIS — Z1283 Encounter for screening for malignant neoplasm of skin: Secondary | ICD-10-CM

## 2015-04-04 NOTE — Telephone Encounter (Signed)
Caller name: Yandell Relation to pt: self Call back number: [phone] Pharmacy:  Reason for call:   Patient needs referral to Dr. Rozann Lesches dermatologist. He has a spot on his face that he wants looked at. Patient will schedule own appt. Would like callback when referral has been placed.

## 2015-04-04 NOTE — Telephone Encounter (Signed)
Insurance Josem Kaufmann #[id number], pt aware

## 2015-04-04 NOTE — Telephone Encounter (Signed)
Referral placed. Please let pt know when approved so he can call to schedule.

## 2015-04-10 ENCOUNTER — Telehealth: Payer: Self-pay | Admitting: Family Medicine

## 2015-04-10 MED ORDER — METFORMIN HCL 500 MG PO TABS: 500.0000 mg | ORAL_TABLET | Freq: Four times a day (QID) | ORAL | Status: DC

## 2015-04-10 MED ORDER — PRAVASTATIN SODIUM 40 MG PO TABS: 40.0000 mg | ORAL_TABLET | Freq: Every day | ORAL | Status: DC

## 2015-04-10 MED ORDER — QUINAPRIL HCL 40 MG PO TABS: 40.0000 mg | ORAL_TABLET | Freq: Every day | ORAL | Status: DC

## 2015-04-10 MED ORDER — ALLOPURINOL 300 MG PO TABS: 300.0000 mg | ORAL_TABLET | Freq: Every day | ORAL | Status: DC

## 2015-04-10 MED ORDER — HYDROCHLOROTHIAZIDE 25 MG PO TABS: 25.0000 mg | ORAL_TABLET | Freq: Every day | ORAL | Status: DC

## 2015-04-10 NOTE — Telephone Encounter (Signed)
meds filled to [facility] as requested.

## 2015-04-10 NOTE — Telephone Encounter (Signed)
Relation to pt: self  Call back number: [phone] Pharmacy: [facility]   Reason for call:  Pt requesting a refill  pravastatin (PRAVACHOL) 40 MG tablet  hydrochlorothiazide (HYDRODIURIL) 25 MG tablet  quinapril (ACCUPRIL) 40 MG tablet  metFORMIN (GLUCOPHAGE) 500 MG tablet  allopurinol (ZYLOPRIM) 300 MG tablet

## 2015-04-11 DIAGNOSIS — M5136 Other intervertebral disc degeneration, lumbar region: Secondary | ICD-10-CM | POA: Diagnosis not present

## 2015-04-11 DIAGNOSIS — M5416 Radiculopathy, lumbar region: Secondary | ICD-10-CM | POA: Diagnosis not present

## 2015-04-11 DIAGNOSIS — M542 Cervicalgia: Secondary | ICD-10-CM | POA: Diagnosis not present

## 2015-04-14 DIAGNOSIS — Z1283 Encounter for screening for malignant neoplasm of skin: Secondary | ICD-10-CM | POA: Diagnosis not present

## 2015-04-14 DIAGNOSIS — M47816 Spondylosis without myelopathy or radiculopathy, lumbar region: Secondary | ICD-10-CM | POA: Diagnosis not present

## 2015-04-14 DIAGNOSIS — L570 Actinic keratosis: Secondary | ICD-10-CM | POA: Diagnosis not present

## 2015-04-14 DIAGNOSIS — X32XXXD Exposure to sunlight, subsequent encounter: Secondary | ICD-10-CM | POA: Diagnosis not present

## 2015-04-14 DIAGNOSIS — L820 Inflamed seborrheic keratosis: Secondary | ICD-10-CM | POA: Diagnosis not present

## 2015-04-16 ENCOUNTER — Encounter: Payer: Self-pay | Admitting: Family Medicine

## 2015-04-21 DIAGNOSIS — M545 Low back pain: Secondary | ICD-10-CM | POA: Diagnosis not present

## 2015-04-28 DIAGNOSIS — C679 Malignant neoplasm of bladder, unspecified: Secondary | ICD-10-CM | POA: Diagnosis not present

## 2015-04-28 DIAGNOSIS — E790 Hyperuricemia without signs of inflammatory arthritis and tophaceous disease: Secondary | ICD-10-CM | POA: Diagnosis not present

## 2015-04-28 DIAGNOSIS — M2550 Pain in unspecified joint: Secondary | ICD-10-CM | POA: Diagnosis not present

## 2015-04-28 DIAGNOSIS — Z79899 Other long term (current) drug therapy: Secondary | ICD-10-CM | POA: Diagnosis not present

## 2015-04-28 DIAGNOSIS — R203 Hyperesthesia: Secondary | ICD-10-CM | POA: Diagnosis not present

## 2015-04-29 ENCOUNTER — Encounter: Payer: Self-pay | Admitting: Family Medicine

## 2015-04-29 ENCOUNTER — Ambulatory Visit (INDEPENDENT_AMBULATORY_CARE_PROVIDER_SITE_OTHER): Payer: Commercial Managed Care - HMO | Admitting: Family Medicine

## 2015-04-29 VITALS — BP 124/72 | HR 78 | Temp 98.4°F | Resp 16 | Wt 282.0 lb

## 2015-04-29 DIAGNOSIS — E1140 Type 2 diabetes mellitus with diabetic neuropathy, unspecified: Secondary | ICD-10-CM

## 2015-04-29 DIAGNOSIS — M545 Low back pain, unspecified: Secondary | ICD-10-CM | POA: Insufficient documentation

## 2015-04-29 DIAGNOSIS — M109 Gout, unspecified: Secondary | ICD-10-CM

## 2015-04-29 DIAGNOSIS — N400 Enlarged prostate without lower urinary tract symptoms: Secondary | ICD-10-CM

## 2015-04-29 DIAGNOSIS — I10 Essential (primary) hypertension: Secondary | ICD-10-CM

## 2015-04-29 LAB — BASIC METABOLIC PANEL
BUN: 16 mg/dL (ref 6–23)
CALCIUM: 9.6 mg/dL (ref 8.4–10.5)
CO2: 31 mEq/L (ref 19–32)
CREATININE: 0.91 mg/dL (ref 0.40–1.50)
Chloride: 100 mEq/L (ref 96–112)
GFR: 87.89 mL/min (ref >60.00)
Glucose, Bld: 135 mg/dL — ABNORMAL HIGH (ref 70–99)
POTASSIUM: 3.8 meq/L (ref 3.5–5.1)
Sodium: 137 mEq/L (ref 135–145)

## 2015-04-29 MED ORDER — HYDROCHLOROTHIAZIDE 12.5 MG PO TABS: 12.5000 mg | ORAL_TABLET | Freq: Every day | ORAL | Status: DC

## 2015-04-29 NOTE — Assessment & Plan Note (Signed)
New.  Pt's pain is muscular in nature.  He has appt w/ ortho tomorrow.  Discussed heat, use of Voltaren gel, and possibly adding muscle relaxer.  Pt wants to hold off at this time and speak w/ ortho.  Will follow.

## 2015-04-29 NOTE — Assessment & Plan Note (Signed)
Chronic problem.  Well controlled.  Reviewed that 25mg  of HCTZ can worsen his insulin resistance and his gout.  Will decrease to 12.5mg  and monitor for improved gout and continued BP control.  Pt expressed understanding and is in agreement w/ plan.

## 2015-04-29 NOTE — Patient Instructions (Signed)
Follow up as scheduled We'll notify you of your lab results We are decreasing the HCTZ to 12.5mg  daily (cut the pills you have in half and then 1 of the new prescription) Drink plenty of fluids to help the gout The back pain is musculoskeletal and the back doctor should be able to help you tomorrow Call with any questions or concerns Rodessa Day!

## 2015-04-29 NOTE — Assessment & Plan Note (Signed)
Chronic problem.  Since starting the Plexus diet, he has had symptomatic lows and decreased his Metformin to 2 tabs qAM and 1 tab qPM.  Will check BMP today to assess current sugar level.  Explained it was too early to recheck A1C.  Reviewed need for low carb diet but to eat regularly since he's on the Amaryl.  If symptomatic lows continue, the next step would be to d/c amaryl.  Pt expressed understanding and is in agreement w/ plan.

## 2015-04-29 NOTE — Progress Notes (Signed)
Pre visit review using our clinic review tool, if applicable. No additional management support is needed unless otherwise documented below in the visit note. 

## 2015-04-29 NOTE — Progress Notes (Signed)
   Subjective:    Patient ID: John Bonilla, male    DOB: [birth year], 69 y.o.   MRN: [id number]  HPI Medication counseling- currently on Plexus diet.  Had a symptomatic low last week.  Decreased Metformin to 2 qAM and 1 qPM.  Wants to make sure that's ok.  Also wants to review all meds to make sure there are no interactions or things that need to be changed.  Back pain- 'pinpoint, very deep inside my body'- sxs started 'a few months' ago  Also has generalized RLB pain.  Saw Urology and was told it was not kidney related.  sxs started within the last few days.  Receiving epidural injxns for LBP.   Review of Systems For ROS see HPI     Objective:   Physical Exam  Constitutional: He appears well-developed and well-nourished. No distress.  obese  HENT:  Head: Normocephalic and atraumatic.  Abdominal: Soft. Bowel sounds are normal. He exhibits no distension. There is no tenderness. There is no rebound and no guarding.  Abdominal obesity  Musculoskeletal: He exhibits tenderness (mild TTP over R paraspinal muscle).  Neurological: He is alert. No cranial nerve deficit. Coordination normal.  Skin: Skin is warm and dry. No rash noted. No erythema.  Psychiatric: He has a normal mood and affect. His behavior is normal. Thought content normal.  Vitals reviewed.         Assessment & Plan:

## 2015-04-30 DIAGNOSIS — M5106 Intervertebral disc disorders with myelopathy, lumbar region: Secondary | ICD-10-CM | POA: Diagnosis not present

## 2015-04-30 DIAGNOSIS — M4806 Spinal stenosis, lumbar region: Secondary | ICD-10-CM | POA: Diagnosis not present

## 2015-05-02 DIAGNOSIS — M542 Cervicalgia: Secondary | ICD-10-CM | POA: Diagnosis not present

## 2015-05-02 DIAGNOSIS — M5412 Radiculopathy, cervical region: Secondary | ICD-10-CM | POA: Diagnosis not present

## 2015-05-21 DIAGNOSIS — M1A09X1 Idiopathic chronic gout, multiple sites, with tophus (tophi): Secondary | ICD-10-CM | POA: Diagnosis not present

## 2015-06-23 ENCOUNTER — Ambulatory Visit (INDEPENDENT_AMBULATORY_CARE_PROVIDER_SITE_OTHER): Payer: Commercial Managed Care - HMO | Admitting: Family Medicine

## 2015-06-23 ENCOUNTER — Encounter: Payer: Self-pay | Admitting: General Practice

## 2015-06-23 ENCOUNTER — Encounter: Payer: Self-pay | Admitting: Family Medicine

## 2015-06-23 VITALS — BP 124/72 | HR 62 | Temp 97.9°F | Resp 16 | Ht 70.0 in | Wt 283.2 lb

## 2015-06-23 DIAGNOSIS — E1140 Type 2 diabetes mellitus with diabetic neuropathy, unspecified: Secondary | ICD-10-CM | POA: Diagnosis not present

## 2015-06-23 LAB — BASIC METABOLIC PANEL
BUN: 20 mg/dL (ref 6–23)
CHLORIDE: 102 meq/L (ref 96–112)
CO2: 30 mEq/L (ref 19–32)
Calcium: 9.7 mg/dL (ref 8.4–10.5)
Creatinine, Ser: 0.86 mg/dL (ref 0.40–1.50)
GFR: 93.77 mL/min (ref >60.00)
GLUCOSE: 136 mg/dL — AB (ref 70–99)
Potassium: 3.9 mEq/L (ref 3.5–5.1)
Sodium: 140 mEq/L (ref 135–145)

## 2015-06-23 LAB — HEMOGLOBIN A1C: Hgb A1c MFr Bld: 6.3 % (ref 4.6–6.5)

## 2015-06-23 MED ORDER — ALLOPURINOL 300 MG PO TABS: 300.0000 mg | ORAL_TABLET | Freq: Every day | ORAL | Status: DC

## 2015-06-23 NOTE — Patient Instructions (Signed)
Schedule your complete physical in 3-4 months We'll notify you of your lab results and make any changes if needed Keep up the good work on healthy diet and attempt to get regular exercise Call with any questions or concerns Have a great summer!!!

## 2015-06-23 NOTE — Progress Notes (Signed)
   Subjective:    Patient ID: John Bonilla, male    DOB: Jan 07, 1946, 69 y.o.   MRN: [id number]  HPI DM- chronic problem, on Metformin 2 in the morning, 1 at night, Glimepiride.  On ACE for renal protection.  Due for eye exam- pt needs referral.  Not checking sugars at home.  Had 1 symptomatic low in the 'last couple of weeks'.  No CP, SOB, HAs, visual changes, edema.  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   Review of Systems For ROS see HPI     Objective:   Physical Exam  Constitutional: He is oriented to person, place, and time. He appears well-developed and well-nourished. No distress.  obese  HENT:  Head: Normocephalic and atraumatic.  Eyes: Conjunctivae and EOM are normal. Pupils are equal, round, and reactive to light.  Neck: Normal range of motion. Neck supple. No thyromegaly present.  Cardiovascular: Normal rate, regular rhythm, normal heart sounds and intact distal pulses.   No murmur heard. Pulmonary/Chest: Effort normal and breath sounds normal. No respiratory distress.  Abdominal: Soft. Bowel sounds are normal. He exhibits no distension.  Musculoskeletal: He exhibits no edema.  Lymphadenopathy:    He has no cervical adenopathy.  Neurological: He is alert and oriented to person, place, and time. No cranial nerve deficit.  Skin: Skin is warm and dry.  Psychiatric: He has a normal mood and affect. His behavior is normal.  Vitals reviewed.         Assessment & Plan:

## 2015-06-23 NOTE — Progress Notes (Signed)
Pre visit review using our clinic review tool, if applicable. No additional management support is needed unless otherwise documented below in the visit note. 

## 2015-06-23 NOTE — Assessment & Plan Note (Signed)
Chronic problem.  Due for eye exam- referral entered.  UTD on foot exam.  On ACE for renal protection.  Pt w/ known neuropathy- not taking gabapentin at this time.  Check labs.  Adjust meds prn

## 2015-07-01 DIAGNOSIS — E119 Type 2 diabetes mellitus without complications: Secondary | ICD-10-CM | POA: Diagnosis not present

## 2015-07-01 LAB — HM DIABETES EYE EXAM

## 2015-07-31 ENCOUNTER — Encounter: Payer: Self-pay | Admitting: General Practice

## 2015-08-04 DIAGNOSIS — C679 Malignant neoplasm of bladder, unspecified: Secondary | ICD-10-CM | POA: Diagnosis not present

## 2015-09-02 ENCOUNTER — Telehealth: Payer: Self-pay | Admitting: Family Medicine

## 2015-09-02 NOTE — Telephone Encounter (Signed)
Noted  

## 2015-09-02 NOTE — Telephone Encounter (Signed)
No new referral is needed. Patient up to date auth through insurance till 10/2015. Left message to have patient call back

## 2015-09-02 NOTE — Telephone Encounter (Addendum)
Relation to pt: self Call back number:[phone]   Reason for call: patient requesting a referral to Dr. Gavin Pound, MD ( new address: [address] [phone] phone # [phone] and fax# [phone]) Appointment is for gout work and determing what kind of arthritis patient has and a plan of action .

## 2015-09-02 NOTE — Telephone Encounter (Signed)
Please advise, pt has [employer] and is established with Dr. Trudie Reed already. Referral was placed 04/2015. Does this have to be completely placed again? Or is there something that only needs to be completed on your end?

## 2015-09-10 ENCOUNTER — Telehealth: Payer: Self-pay | Admitting: Family Medicine

## 2015-09-10 DIAGNOSIS — M542 Cervicalgia: Secondary | ICD-10-CM

## 2015-09-10 NOTE — Telephone Encounter (Signed)
[name] for referral? Dx neck pain ?

## 2015-09-10 NOTE — Telephone Encounter (Signed)
Referral placed today. Pt has an appt for next week but would like to see if he might be able to be seen sooner?

## 2015-09-10 NOTE — Telephone Encounter (Signed)
[place] for referral for neck pain.  I doubt there is a sooner appt but referral coordinators can certainly try

## 2015-09-10 NOTE — Telephone Encounter (Signed)
Caller name: John Bonilla   Relationship to patient: Self   Can be reached: [phone] Pharmacy:   Reason for call: Pt is requesting a referral to Dr. Jodi Mourning for his neck pain. He says that he is having a hard time sleeping. He need insurance aut. He provided that phone number : [phone]  to call.    Pt says that he has an appt with them for 10/6 but want to know if he can get a sooner appt due to the pain and lack of sleep he's getting.

## 2015-09-10 NOTE — Telephone Encounter (Signed)
Insurance auth # [id number], pt is on wait list

## 2015-09-18 DIAGNOSIS — M542 Cervicalgia: Secondary | ICD-10-CM | POA: Diagnosis not present

## 2015-09-18 DIAGNOSIS — M5412 Radiculopathy, cervical region: Secondary | ICD-10-CM | POA: Diagnosis not present

## 2015-10-10 ENCOUNTER — Telehealth: Payer: Self-pay | Admitting: Family Medicine

## 2015-10-10 DIAGNOSIS — M1990 Unspecified osteoarthritis, unspecified site: Secondary | ICD-10-CM

## 2015-10-10 DIAGNOSIS — Z8739 Personal history of other diseases of the musculoskeletal system and connective tissue: Secondary | ICD-10-CM

## 2015-10-10 NOTE — Telephone Encounter (Signed)
Relation to UA:UEBV Call back number:[phone]   Reason for call:  Requesting a referral to dr. Vernetta Honey rhematology for rheumatoid arthritis and gout   Gavin Pound  [address], [place], [place] [address]  Phone: [phone]

## 2015-10-10 NOTE — Telephone Encounter (Signed)
Referral placed.

## 2015-10-13 DIAGNOSIS — M545 Low back pain: Secondary | ICD-10-CM | POA: Diagnosis not present

## 2015-10-14 DIAGNOSIS — M47812 Spondylosis without myelopathy or radiculopathy, cervical region: Secondary | ICD-10-CM | POA: Diagnosis not present

## 2015-10-23 DIAGNOSIS — M4802 Spinal stenosis, cervical region: Secondary | ICD-10-CM | POA: Diagnosis not present

## 2015-10-24 DIAGNOSIS — C679 Malignant neoplasm of bladder, unspecified: Secondary | ICD-10-CM | POA: Diagnosis not present

## 2015-10-27 DIAGNOSIS — M150 Primary generalized (osteo)arthritis: Secondary | ICD-10-CM | POA: Diagnosis not present

## 2015-10-27 DIAGNOSIS — M4802 Spinal stenosis, cervical region: Secondary | ICD-10-CM | POA: Diagnosis not present

## 2015-10-27 DIAGNOSIS — Z6839 Body mass index (BMI) 39.0-39.9, adult: Secondary | ICD-10-CM | POA: Diagnosis not present

## 2015-10-27 DIAGNOSIS — I10 Essential (primary) hypertension: Secondary | ICD-10-CM | POA: Diagnosis not present

## 2015-10-27 DIAGNOSIS — M1009 Idiopathic gout, multiple sites: Secondary | ICD-10-CM | POA: Diagnosis not present

## 2015-10-27 DIAGNOSIS — Z79899 Other long term (current) drug therapy: Secondary | ICD-10-CM | POA: Diagnosis not present

## 2015-10-27 DIAGNOSIS — M792 Neuralgia and neuritis, unspecified: Secondary | ICD-10-CM | POA: Diagnosis not present

## 2015-10-28 ENCOUNTER — Other Ambulatory Visit (HOSPITAL_COMMUNITY): Payer: Self-pay | Admitting: Neurological Surgery

## 2015-11-07 ENCOUNTER — Other Ambulatory Visit: Payer: Self-pay | Admitting: Family Medicine

## 2015-11-13 ENCOUNTER — Telehealth: Payer: Self-pay

## 2015-11-14 ENCOUNTER — Encounter: Payer: Commercial Managed Care - HMO | Admitting: Family Medicine

## 2015-11-14 ENCOUNTER — Encounter: Payer: Self-pay | Admitting: Family Medicine

## 2015-11-14 ENCOUNTER — Ambulatory Visit (INDEPENDENT_AMBULATORY_CARE_PROVIDER_SITE_OTHER): Payer: Commercial Managed Care - HMO | Admitting: Family Medicine

## 2015-11-14 VITALS — BP 126/76 | HR 76 | Temp 97.9°F | Resp 16 | Ht 70.0 in | Wt 288.4 lb

## 2015-11-14 DIAGNOSIS — I10 Essential (primary) hypertension: Secondary | ICD-10-CM

## 2015-11-14 DIAGNOSIS — E1169 Type 2 diabetes mellitus with other specified complication: Secondary | ICD-10-CM | POA: Diagnosis not present

## 2015-11-14 DIAGNOSIS — E785 Hyperlipidemia, unspecified: Secondary | ICD-10-CM | POA: Diagnosis not present

## 2015-11-14 DIAGNOSIS — Z23 Encounter for immunization: Secondary | ICD-10-CM | POA: Diagnosis not present

## 2015-11-14 DIAGNOSIS — Z0000 Encounter for general adult medical examination without abnormal findings: Secondary | ICD-10-CM | POA: Diagnosis not present

## 2015-11-14 DIAGNOSIS — E1140 Type 2 diabetes mellitus with diabetic neuropathy, unspecified: Secondary | ICD-10-CM | POA: Diagnosis not present

## 2015-11-14 LAB — HEPATIC FUNCTION PANEL
ALK PHOS: 50 U/L (ref 39–117)
ALT: 34 U/L (ref 0–53)
AST: 27 U/L (ref 0–37)
Albumin: 4.3 g/dL (ref 3.5–5.2)
BILIRUBIN DIRECT: 0.2 mg/dL (ref 0.0–0.3)
BILIRUBIN TOTAL: 0.6 mg/dL (ref 0.2–1.2)
Total Protein: 7.2 g/dL (ref 6.0–8.3)

## 2015-11-14 LAB — CBC WITH DIFFERENTIAL/PLATELET
BASOS ABS: 0 10*3/uL (ref 0.0–0.1)
BASOS PCT: 0.7 % (ref 0.0–3.0)
EOS PCT: 3.9 % (ref 0.0–5.0)
Eosinophils Absolute: 0.3 10*3/uL (ref 0.0–0.7)
HEMATOCRIT: 44.6 % (ref 39.0–52.0)
Hemoglobin: 14.9 g/dL (ref 13.0–17.0)
LYMPHS ABS: 1.7 10*3/uL (ref 0.7–4.0)
LYMPHS PCT: 25.7 % (ref 12.0–46.0)
MCHC: 33.3 g/dL (ref 30.0–36.0)
MCV: 103.8 fl — AB (ref 78.0–100.0)
MONOS PCT: 9.4 % (ref 3.0–12.0)
Monocytes Absolute: 0.6 10*3/uL (ref 0.1–1.0)
NEUTROS ABS: 4 10*3/uL (ref 1.4–7.7)
NEUTROS PCT: 60.3 % (ref 43.0–77.0)
PLATELETS: 233 10*3/uL (ref 150.0–400.0)
RBC: 4.3 Mil/uL (ref 4.22–5.81)
RDW: 15 % (ref 11.5–15.5)
WBC: 6.6 10*3/uL (ref 4.0–10.5)

## 2015-11-14 LAB — TSH: TSH: 1.74 u[IU]/mL (ref 0.35–4.50)

## 2015-11-14 LAB — LIPID PANEL
Cholesterol: 142 mg/dL (ref 0–200)
HDL: 44.1 mg/dL (ref >39.00)
LDL CALC: 77 mg/dL (ref 0–99)
NONHDL: 97.7
TRIGLYCERIDES: 103 mg/dL (ref 0.0–149.0)
Total CHOL/HDL Ratio: 3
VLDL: 20.6 mg/dL (ref 0.0–40.0)

## 2015-11-14 LAB — BASIC METABOLIC PANEL
BUN: 19 mg/dL (ref 6–23)
CALCIUM: 9.6 mg/dL (ref 8.4–10.5)
CO2: 28 mEq/L (ref 19–32)
Chloride: 100 mEq/L (ref 96–112)
Creatinine, Ser: 0.85 mg/dL (ref 0.40–1.50)
GFR: 94.94 mL/min (ref >60.00)
Glucose, Bld: 140 mg/dL — ABNORMAL HIGH (ref 70–99)
POTASSIUM: 3.8 meq/L (ref 3.5–5.1)
SODIUM: 138 meq/L (ref 135–145)

## 2015-11-14 LAB — HEMOGLOBIN A1C: HEMOGLOBIN A1C: 7 % — AB (ref 4.6–6.5)

## 2015-11-14 NOTE — Patient Instructions (Signed)
Follow up in 3-4 months to recheck diabetes We'll notify you of your lab results and make any changes if needed Continue to work on healthy diet and regular exercise- you can do it!!! You are not due for colonoscopy until 2018- yay!!! Have Dr Ivory Broad send me copies of his notes so I can follow along Call with any questions or concerns If you want to join [place] at the new [place] office, any scheduled appointments will automatically transfer and we will see you at [address] [place] [address] [place], [place] [address] (OPENING 12/16/15) Happy Holidays!!!

## 2015-11-14 NOTE — Progress Notes (Signed)
   Subjective:    Patient ID: John Bonilla, male    DOB: 08-06-1946, 69 y.o.   MRN: [id number]  HPI Here today for CPE.  Risk Factors: HTN- chronic problem, on HCTZ and Quinapril w/ good control. Hyperlipidemia- chronic problem, on Pravastatin DM- chronic problem, on Amaryl and Metformin.  On ACE for renal protection.  UTD on foot exam, eye exam. Obesity- pt has gained another 5 lbs Physical Activity: active but no formal exercise Fall Risk: low Depression: denies current sxs Hearing: normal to conversational tones, mildly decreased to whispered voice ADL's: independent Cognitive: normal linear thought process, memory and attention intact Home Safety: safe at home, lives w/ wife Height, Weight, BMI, Visual Acuity: see vitals, vision corrected to 20/20 w/ glasses Counseling: UTD on colonoscopy (2008, due in 2018), Urology.  Due for flu and prevnar. Care team reviewed and updated Labs Ordered: See A&P Care Plan: See A&P    Review of Systems Patient reports no vision/hearing changes, anorexia, fever ,adenopathy, persistant/recurrent hoarseness, swallowing issues, chest pain, palpitations, edema, persistant/recurrent cough, hemoptysis, dyspnea (rest,exertional, paroxysmal nocturnal), gastrointestinal  bleeding (melena, rectal bleeding), abdominal pain, excessive heart burn, GU symptoms (dysuria, hematuria, voiding/incontinence issues) syncope, focal weakness, memory loss, skin/hair/nail changes, depression, anxiety, abnormal bruising/bleeding, musculoskeletal symptoms/signs.   + numbness of arms due to neck problems.    Objective:   Physical Exam General Appearance:    Alert, cooperative, no distress, appears stated age  Head:    Normocephalic, without obvious abnormality, atraumatic  Eyes:    PERRL, conjunctiva/corneas clear, EOM's intact, fundi    benign, both eyes       Ears:    Normal TM's and external ear canals, both ears  Nose:   Nares normal, septum midline, mucosa  normal, no drainage   or sinus tenderness  Throat:   Lips, mucosa, and tongue normal; teeth and gums normal  Neck:   Supple, symmetrical, trachea midline, no adenopathy;       thyroid:  No enlargement/tenderness/nodules  Back:     Symmetric, no curvature, ROM normal, no CVA tenderness  Lungs:     Clear to auscultation bilaterally, respirations unlabored  Chest wall:    No tenderness or deformity  Heart:    Regular rate and rhythm, S1 and S2 normal, no murmur, rub   or gallop  Abdomen:     Soft, non-tender, bowel sounds active all four quadrants,    no masses, no organomegaly  Genitalia:    Deferred to urology  Rectal:    Extremities:   Extremities normal, atraumatic, no cyanosis or edema  Pulses:   2+ and symmetric all extremities  Skin:   Skin color, texture, turgor normal, no rashes or lesions  Lymph nodes:   Cervical, supraclavicular, and axillary nodes normal  Neurologic:   CNII-XII intact. Normal strength, sensation and reflexes      throughout          Assessment & Plan:

## 2015-11-14 NOTE — Progress Notes (Signed)
Pre visit review using our clinic review tool, if applicable. No additional management support is needed unless otherwise documented below in the visit note. 

## 2015-11-16 NOTE — Assessment & Plan Note (Signed)
Chronic problem.  Pt continues to gain weight.  Stressed need for healthy diet and regular exercise.  Check labs to risk stratify.  Will continue to follow.

## 2015-11-16 NOTE — Assessment & Plan Note (Signed)
Pt's PE WNL w/ exception of obesity.  UTD on colonoscopy (due 2018), urology.  Flu and Prevnar given today.  Check labs.  Anticipatory guidance provided.

## 2015-11-16 NOTE — Assessment & Plan Note (Signed)
Chronic problem.  UTD on eye exam, foot exam.  On ACE for renal protection.  Stressed need for healthy diet and regular exercise.  Check labs.  Adjust meds prn

## 2015-11-16 NOTE — Assessment & Plan Note (Signed)
Chronic problem.  Tolerating statin w/o difficulty.  Check labs.  Adjust meds prn  

## 2015-11-16 NOTE — Assessment & Plan Note (Signed)
Chronic problem.  Adequate control.  Asymptomatic.  Check labs.  No anticipated med changes 

## 2015-11-17 DIAGNOSIS — L820 Inflamed seborrheic keratosis: Secondary | ICD-10-CM | POA: Diagnosis not present

## 2015-11-17 DIAGNOSIS — D225 Melanocytic nevi of trunk: Secondary | ICD-10-CM | POA: Diagnosis not present

## 2015-11-17 DIAGNOSIS — L821 Other seborrheic keratosis: Secondary | ICD-10-CM | POA: Diagnosis not present

## 2015-11-17 DIAGNOSIS — Z1283 Encounter for screening for malignant neoplasm of skin: Secondary | ICD-10-CM | POA: Diagnosis not present

## 2015-11-19 ENCOUNTER — Encounter (HOSPITAL_COMMUNITY)
Admission: RE | Admit: 2015-11-19 | Discharge: 2015-11-19 | Disposition: A | Discharge status: Home / Self Care | Payer: Commercial Managed Care - HMO | Source: Ambulatory Visit | Attending: Neurological Surgery | Admitting: Neurological Surgery

## 2015-11-19 ENCOUNTER — Encounter (HOSPITAL_COMMUNITY): Payer: Self-pay

## 2015-11-19 ENCOUNTER — Ambulatory Visit (HOSPITAL_COMMUNITY)
Admission: RE | Admit: 2015-11-19 | Discharge: 2015-11-19 | Disposition: A | Discharge status: Home / Self Care | Payer: Commercial Managed Care - HMO | Source: Ambulatory Visit | Attending: Neurological Surgery | Admitting: Neurological Surgery

## 2015-11-19 DIAGNOSIS — J9811 Atelectasis: Secondary | ICD-10-CM | POA: Insufficient documentation

## 2015-11-19 DIAGNOSIS — M4322 Fusion of spine, cervical region: Secondary | ICD-10-CM | POA: Diagnosis not present

## 2015-11-19 DIAGNOSIS — R918 Other nonspecific abnormal finding of lung field: Secondary | ICD-10-CM | POA: Diagnosis not present

## 2015-11-19 DIAGNOSIS — Z01812 Encounter for preprocedural laboratory examination: Secondary | ICD-10-CM | POA: Diagnosis not present

## 2015-11-19 DIAGNOSIS — M4802 Spinal stenosis, cervical region: Secondary | ICD-10-CM

## 2015-11-19 DIAGNOSIS — Z01818 Encounter for other preprocedural examination: Secondary | ICD-10-CM | POA: Insufficient documentation

## 2015-11-19 DIAGNOSIS — I517 Cardiomegaly: Secondary | ICD-10-CM | POA: Diagnosis not present

## 2015-11-19 DIAGNOSIS — Z01810 Encounter for preprocedural cardiovascular examination: Secondary | ICD-10-CM | POA: Diagnosis not present

## 2015-11-19 DIAGNOSIS — Z87891 Personal history of nicotine dependence: Secondary | ICD-10-CM | POA: Insufficient documentation

## 2015-11-19 HISTORY — DX: Sleep apnea, unspecified: G47.30

## 2015-11-19 HISTORY — DX: Gastro-esophageal reflux disease without esophagitis: K21.9

## 2015-11-19 HISTORY — DX: Personal history of other diseases of the digestive system: Z87.19

## 2015-11-19 HISTORY — DX: Reserved for inherently not codable concepts without codable children: IMO0001

## 2015-11-19 HISTORY — DX: Personal history of urinary calculi: Z87.442

## 2015-11-19 LAB — BASIC METABOLIC PANEL
Anion gap: 10 (ref 5–15)
BUN: 15 mg/dL (ref 6–20)
CHLORIDE: 101 mmol/L (ref 101–111)
CO2: 28 mmol/L (ref 22–32)
Calcium: 9.7 mg/dL (ref 8.9–10.3)
Creatinine, Ser: 0.81 mg/dL (ref 0.61–1.24)
GFR calc Af Amer: >60 mL/min (ref >60)
GFR calc non Af Amer: >60 mL/min (ref >60)
GLUCOSE: 253 mg/dL — AB (ref 65–99)
POTASSIUM: 4 mmol/L (ref 3.5–5.1)
Sodium: 139 mmol/L (ref 135–145)

## 2015-11-19 LAB — CBC WITH DIFFERENTIAL/PLATELET
BASOS ABS: 0.1 10*3/uL (ref 0.0–0.1)
BASOS PCT: 1 %
Eosinophils Absolute: 0.3 10*3/uL (ref 0.0–0.7)
Eosinophils Relative: 5 %
HEMATOCRIT: 42.4 % (ref 39.0–52.0)
Hemoglobin: 14.6 g/dL (ref 13.0–17.0)
LYMPHS ABS: 1.4 10*3/uL (ref 0.7–4.0)
LYMPHS PCT: 23 %
MCH: 35.2 pg — ABNORMAL HIGH (ref 26.0–34.0)
MCHC: 34.4 g/dL (ref 30.0–36.0)
MCV: 102.2 fL — AB (ref 78.0–100.0)
MONO ABS: 0.7 10*3/uL (ref 0.1–1.0)
Monocytes Relative: 11 %
NEUTROS ABS: 3.7 10*3/uL (ref 1.7–7.7)
Neutrophils Relative %: 60 %
Platelets: 223 10*3/uL (ref 150–400)
RBC: 4.15 MIL/uL — AB (ref 4.22–5.81)
RDW: 13.8 % (ref 11.5–15.5)
WBC: 6.1 10*3/uL (ref 4.0–10.5)

## 2015-11-19 LAB — SURGICAL PCR SCREEN
MRSA, PCR: NEGATIVE
Staphylococcus aureus: NEGATIVE

## 2015-11-19 LAB — GLUCOSE, CAPILLARY: Glucose-Capillary: 221 mg/dL — ABNORMAL HIGH (ref 65–99)

## 2015-11-19 LAB — PROTIME-INR
INR: 1.08 (ref 0.00–1.49)
Prothrombin Time: 14.2 seconds (ref 11.6–15.2)

## 2015-11-19 NOTE — Pre-Procedure Instructions (Addendum)
SAMRATH PELT  11/19/2015      [id number] [place], [place] [address] [place] [address] Phone: [phone] Fax: [phone] [place], [place] [address] Phone: [phone] Fax: [phone]    Your procedure is scheduled on  Wednesday  11/26/15  Report to [facility] Admitting at 945 A.M.  Call this number if you have problems the morning of surgery:  [phone]   Remember:  Do not eat food or drink liquids after midnight.  Take these medicines the morning of surgery with A SIP OF WATER    ALLOPURINOL, COLCHICINE, GABAPENTIN,    (STOP ASPIRIN, VOLTAREN/ DICLOFENAC GEL, MELOXICAM, MULTIVITAMIN, VIT E) How to Manage Your Diabetes Before Surgery   Why is it important to control my blood sugar before and after surgery?   Improving blood sugar levels before and after surgery helps healing and can limit problems.  A way of improving blood sugar control is eating a healthy diet by:  - Eating less sugar and carbohydrates  - Increasing activity/exercise  - Talk with your doctor about reaching your blood sugar goals  High blood sugars (greater than 180 mg/dL) can raise your risk of infections and slow down your recovery so you will need to focus on controlling your diabetes during the weeks before surgery.  Make sure that the doctor who takes care of your diabetes knows about your planned surgery including the date and location.  How do I manage my blood sugars before surgery?   Check your blood sugar at least 4 times a day, 2 days before surgery to make sure that they are not too high or low.   Check your blood sugar the morning of your surgery when you wake up and every 2               hours until you get to the Short-Stay unit.  If your blood sugar is less than 70 mg/dL, you will need to treat for low blood sugar  by:  Treat a low blood sugar (less than 70 mg/dL) with 1/2 cup of clear juice (cranberry or apple), 4 glucose tablets, OR glucose gel.  Recheck blood sugar in 15 minutes after treatment (to make sure it is greater than 70 mg/dL).  If blood sugar is not greater than 70 mg/dL on re-check, call [phone] for further instructions.   Report your blood sugar to the Short-Stay nurse when you get to Short-Stay.  References:  [facility], 2007 "How to Manage your Diabetes Before and After Surgery".  What do I do about my diabetes medications?   Do not take oral diabetes medicines (pills) the morning of surgery    If your CBG is greater than 220 mg/dL, you may take 1/2 of your sliding scale (correction) dose of insulin.   For patients with "Insulin Pumps":  Contact your diabetes doctor for specific instructions before surgery.   Decrease basal insulin rates by 20% at midnight the night before surgery.  Note that if your surgery is planned to be longer than 2 hours, your insulin pump will be removed and intravenous (IV) insulin will be started and managed by the nurses and anesthesiologist.  You will be able to restart your insulin pump once you are awake and able to manage it.  Make sure to bring insulin pump supplies to the hospital  with you in case your site needs to be changed.        Do not wear jewelry, make-up or nail polish.  Do not wear lotions, powders, or perfumes.  You may wear deodorant.  Do not shave 48 hours prior to surgery.  Men may shave face and neck.  Do not bring valuables to the hospital.  [facility] is not responsible for any belongings or valuables.  Contacts, dentures or bridgework may not be worn into surgery.  Leave your suitcase in the car.  After surgery it may be brought to your room.  For patients admitted to the hospital, discharge time will be determined by your treatment team.  Patients discharged the day of surgery  will not be allowed to drive home.   Name and phone number of your driver:    Special instructions:  SEE PREPARING FOR SURGERY HANDOUT  Please read over the following fact sheets that you were given. Pain Booklet, Coughing and Deep Breathing, Total Joint Packet, MRSA Information and Surgical Site Infection Prevention

## 2015-11-19 NOTE — Progress Notes (Addendum)
Anesthesia Chart Review:  Pt is 69 year old male scheduled for C5-6 ACDF on 11/26/2015 with Dr. Ronnald Ramp.   PMH includes:  HTN, DM, hyperlipidemia, OSA (CPAP), bladder cancer, GERD. Former smoker. BMI 41.   Medications include: ASA, glimepiride, metformin, pravastatin, quinapril.   Preoperative labs reviewed.  Glucose 253. HgbA1c was 7.0 on 11/14/15.   Chest x-ray 11/19/15 reviewed.  1. Low lung volumes with mild bibasilar atelectasis. 2. Stable cardiomegaly   EKG 11/19/15: NSR. Possible Inferior infarct, age undetermined RSR' or QR pattern in V1 suggests right ventricular conduction delay. No significant change since last tracing 05/20/14 per Dr. Nelly Laurence interpretation.   Reviewed case with Dr. Kalman Shan.   If no changes, I anticipate pt can proceed with surgery as scheduled.   Willeen Cass, FNP-BC [facility] Stay [facility]/Anesthesiology Phone: [phone] 11/20/2015 4:53 PM

## 2015-11-20 ENCOUNTER — Other Ambulatory Visit: Payer: Self-pay | Admitting: General Practice

## 2015-11-20 LAB — HEMOGLOBIN A1C
HEMOGLOBIN A1C: 7.2 % — AB (ref 4.8–5.6)
Mean Plasma Glucose: 160 mg/dL

## 2015-11-20 MED ORDER — GLIMEPIRIDE 1 MG PO TABS: 1.0000 mg | ORAL_TABLET | Freq: Every day | ORAL | Status: DC

## 2015-11-24 ENCOUNTER — Other Ambulatory Visit: Payer: Self-pay | Admitting: General Practice

## 2015-11-24 MED ORDER — GLIMEPIRIDE 1 MG PO TABS: 1.0000 mg | ORAL_TABLET | Freq: Every day | ORAL | Status: DC

## 2015-11-25 MED ORDER — CEFAZOLIN SODIUM 10 G IJ SOLR
3.0000 g | INTRAMUSCULAR | Status: AC
  Administered 2015-11-26: 3 g via INTRAVENOUS
  Filled 2015-11-25: qty 3000

## 2015-11-25 MED ORDER — DEXAMETHASONE SODIUM PHOSPHATE 10 MG/ML IJ SOLN: 10.0000 mg | INTRAMUSCULAR | Status: DC

## 2015-11-25 NOTE — Progress Notes (Signed)
Pt's wife notified of surgery time change and instructed her to have pt here at 8:20 AM. She voiced understanding.

## 2015-11-26 ENCOUNTER — Encounter (HOSPITAL_COMMUNITY)
Admission: AD | Disposition: A | Discharge status: Home / Self Care | Payer: Self-pay | Source: Ambulatory Visit | Attending: Neurological Surgery

## 2015-11-26 ENCOUNTER — Inpatient Hospital Stay (HOSPITAL_COMMUNITY)
Admission: AD | Admit: 2015-11-26 | Discharge: 2015-11-27 | DRG: 473 | Disposition: A | Discharge status: Home / Self Care | Payer: Commercial Managed Care - HMO | Source: Ambulatory Visit | Attending: Neurological Surgery | Admitting: Neurological Surgery

## 2015-11-26 ENCOUNTER — Inpatient Hospital Stay (HOSPITAL_COMMUNITY): Payer: Commercial Managed Care - HMO | Admitting: Anesthesiology

## 2015-11-26 ENCOUNTER — Inpatient Hospital Stay (HOSPITAL_COMMUNITY): Payer: Commercial Managed Care - HMO | Admitting: Emergency Medicine

## 2015-11-26 ENCOUNTER — Inpatient Hospital Stay (HOSPITAL_COMMUNITY): Payer: Commercial Managed Care - HMO

## 2015-11-26 ENCOUNTER — Encounter (HOSPITAL_COMMUNITY): Payer: Self-pay | Admitting: Anesthesiology

## 2015-11-26 DIAGNOSIS — Z9842 Cataract extraction status, left eye: Secondary | ICD-10-CM | POA: Diagnosis not present

## 2015-11-26 DIAGNOSIS — G4730 Sleep apnea, unspecified: Secondary | ICD-10-CM | POA: Diagnosis present

## 2015-11-26 DIAGNOSIS — Z9841 Cataract extraction status, right eye: Secondary | ICD-10-CM | POA: Diagnosis not present

## 2015-11-26 DIAGNOSIS — M50222 Other cervical disc displacement at C5-C6 level: Secondary | ICD-10-CM | POA: Diagnosis present

## 2015-11-26 DIAGNOSIS — K219 Gastro-esophageal reflux disease without esophagitis: Secondary | ICD-10-CM | POA: Diagnosis not present

## 2015-11-26 DIAGNOSIS — E119 Type 2 diabetes mellitus without complications: Secondary | ICD-10-CM | POA: Diagnosis present

## 2015-11-26 DIAGNOSIS — Z87891 Personal history of nicotine dependence: Secondary | ICD-10-CM | POA: Diagnosis not present

## 2015-11-26 DIAGNOSIS — I10 Essential (primary) hypertension: Secondary | ICD-10-CM | POA: Diagnosis not present

## 2015-11-26 DIAGNOSIS — Z961 Presence of intraocular lens: Secondary | ICD-10-CM | POA: Diagnosis present

## 2015-11-26 DIAGNOSIS — Z7984 Long term (current) use of oral hypoglycemic drugs: Secondary | ICD-10-CM

## 2015-11-26 DIAGNOSIS — Z79899 Other long term (current) drug therapy: Secondary | ICD-10-CM | POA: Diagnosis not present

## 2015-11-26 DIAGNOSIS — E785 Hyperlipidemia, unspecified: Secondary | ICD-10-CM | POA: Diagnosis present

## 2015-11-26 DIAGNOSIS — M4322 Fusion of spine, cervical region: Secondary | ICD-10-CM | POA: Diagnosis not present

## 2015-11-26 DIAGNOSIS — M1990 Unspecified osteoarthritis, unspecified site: Secondary | ICD-10-CM | POA: Diagnosis not present

## 2015-11-26 DIAGNOSIS — Z419 Encounter for procedure for purposes other than remedying health state, unspecified: Secondary | ICD-10-CM

## 2015-11-26 DIAGNOSIS — Z981 Arthrodesis status: Secondary | ICD-10-CM

## 2015-11-26 DIAGNOSIS — M542 Cervicalgia: Secondary | ICD-10-CM | POA: Diagnosis present

## 2015-11-26 DIAGNOSIS — M4806 Spinal stenosis, lumbar region: Secondary | ICD-10-CM | POA: Diagnosis not present

## 2015-11-26 DIAGNOSIS — M4802 Spinal stenosis, cervical region: Principal | ICD-10-CM | POA: Diagnosis present

## 2015-11-26 HISTORY — PX: ANTERIOR CERVICAL DECOMP/DISCECTOMY FUSION: SHX1161

## 2015-11-26 LAB — GLUCOSE, CAPILLARY
Glucose-Capillary: 172 mg/dL — ABNORMAL HIGH (ref 65–99)
Glucose-Capillary: 152 mg/dL — ABNORMAL HIGH (ref 65–99)
Glucose-Capillary: 178 mg/dL — ABNORMAL HIGH (ref 65–99)
Glucose-Capillary: 176 mg/dL — ABNORMAL HIGH (ref 65–99)

## 2015-11-26 SURGERY — ANTERIOR CERVICAL DECOMPRESSION/DISCECTOMY FUSION 1 LEVEL
Anesthesia: General | Site: Neck

## 2015-11-26 MED ORDER — HYDROCODONE-ACETAMINOPHEN 7.5-325 MG PO TABS: 1.0000 | ORAL_TABLET | Freq: Once | ORAL | Status: DC | PRN

## 2015-11-26 MED ORDER — METHOCARBAMOL 500 MG PO TABS
500.0000 mg | ORAL_TABLET | Freq: Four times a day (QID) | ORAL | Status: DC | PRN
  Administered 2015-11-26 – 2015-11-27 (×3): 500 mg via ORAL
  Filled 2015-11-26 (×3): qty 1

## 2015-11-26 MED ORDER — MIDAZOLAM HCL 5 MG/5ML IJ SOLN
INTRAMUSCULAR | Status: DC | PRN
Start: 2015-11-26 — End: 2015-11-26
  Administered 2015-11-26: 2 mg via INTRAVENOUS

## 2015-11-26 MED ORDER — GABAPENTIN 300 MG PO CAPS
300.0000 mg | ORAL_CAPSULE | Freq: Three times a day (TID) | ORAL | Status: DC
  Administered 2015-11-26 (×2): 300 mg via ORAL
  Filled 2015-11-26 (×2): qty 1

## 2015-11-26 MED ORDER — GLYCOPYRROLATE 0.2 MG/ML IJ SOLN
INTRAMUSCULAR | Status: AC
  Filled 2015-11-26: qty 3

## 2015-11-26 MED ORDER — SODIUM CHLORIDE 0.9 % IJ SOLN
3.0000 mL | Freq: Two times a day (BID) | INTRAMUSCULAR | Status: DC
  Administered 2015-11-26: 3 mL via INTRAVENOUS

## 2015-11-26 MED ORDER — FENTANYL CITRATE (PF) 250 MCG/5ML IJ SOLN
INTRAMUSCULAR | Status: AC
  Filled 2015-11-26: qty 5

## 2015-11-26 MED ORDER — ADULT MULTIVITAMIN W/MINERALS CH
1.0000 | ORAL_TABLET | Freq: Every day | ORAL | Status: DC
  Administered 2015-11-26: 1 via ORAL
  Filled 2015-11-26: qty 1

## 2015-11-26 MED ORDER — ROCURONIUM BROMIDE 50 MG/5ML IV SOLN
INTRAVENOUS | Status: AC
  Filled 2015-11-26: qty 3

## 2015-11-26 MED ORDER — NEOSTIGMINE METHYLSULFATE 10 MG/10ML IV SOLN
INTRAVENOUS | Status: DC | PRN
  Administered 2015-11-26: 5 mg via INTRAVENOUS

## 2015-11-26 MED ORDER — MIDAZOLAM HCL 2 MG/2ML IJ SOLN
INTRAMUSCULAR | Status: AC
  Filled 2015-11-26: qty 2

## 2015-11-26 MED ORDER — SODIUM CHLORIDE 0.9 % IJ SOLN: 3.0000 mL | INTRAMUSCULAR | Status: DC | PRN

## 2015-11-26 MED ORDER — PROPOFOL 10 MG/ML IV BOLUS
INTRAVENOUS | Status: DC | PRN
  Administered 2015-11-26: 160 mg via INTRAVENOUS

## 2015-11-26 MED ORDER — COLCHICINE 0.6 MG PO TABS: 0.6000 mg | ORAL_TABLET | Freq: Every day | ORAL | Status: DC

## 2015-11-26 MED ORDER — HYDROMORPHONE HCL 1 MG/ML IJ SOLN
0.2500 mg | INTRAMUSCULAR | Status: DC | PRN
Start: 2015-11-26 — End: 2015-11-26
  Administered 2015-11-26 (×2): 0.5 mg via INTRAVENOUS

## 2015-11-26 MED ORDER — 0.9 % SODIUM CHLORIDE (POUR BTL) OPTIME
TOPICAL | Status: DC | PRN
  Administered 2015-11-26: 1000 mL

## 2015-11-26 MED ORDER — LIDOCAINE HCL 1 % IJ SOLN
INTRAMUSCULAR | Status: DC | PRN
  Administered 2015-11-26: 80 mg via INTRADERMAL

## 2015-11-26 MED ORDER — MORPHINE SULFATE (PF) 2 MG/ML IV SOLN: 1.0000 mg | INTRAVENOUS | Status: DC | PRN

## 2015-11-26 MED ORDER — EPHEDRINE SULFATE 50 MG/ML IJ SOLN
INTRAMUSCULAR | Status: AC
  Filled 2015-11-26: qty 1

## 2015-11-26 MED ORDER — LACTATED RINGERS IV SOLN
INTRAVENOUS | Status: DC | PRN
  Administered 2015-11-26 (×2): via INTRAVENOUS

## 2015-11-26 MED ORDER — FENTANYL CITRATE (PF) 250 MCG/5ML IJ SOLN
INTRAMUSCULAR | Status: DC | PRN
  Administered 2015-11-26 (×3): 100 ug via INTRAVENOUS
  Administered 2015-11-26: 50 ug via INTRAVENOUS

## 2015-11-26 MED ORDER — GLIMEPIRIDE 1 MG PO TABS
1.0000 mg | ORAL_TABLET | Freq: Every day | ORAL | Status: DC
  Administered 2015-11-27: 1 mg via ORAL
  Filled 2015-11-26 (×2): qty 1

## 2015-11-26 MED ORDER — PHENYLEPHRINE 40 MCG/ML (10ML) SYRINGE FOR IV PUSH (FOR BLOOD PRESSURE SUPPORT)
PREFILLED_SYRINGE | INTRAVENOUS | Status: AC
  Filled 2015-11-26: qty 10

## 2015-11-26 MED ORDER — QUINAPRIL HCL 10 MG PO TABS
40.0000 mg | ORAL_TABLET | Freq: Every day | ORAL | Status: DC
  Administered 2015-11-26: 40 mg via ORAL
  Filled 2015-11-26 (×2): qty 4

## 2015-11-26 MED ORDER — POTASSIUM CHLORIDE IN NACL 20-0.9 MEQ/L-% IV SOLN
INTRAVENOUS | Status: DC
  Filled 2015-11-26 (×3): qty 1000

## 2015-11-26 MED ORDER — SUCCINYLCHOLINE CHLORIDE 20 MG/ML IJ SOLN
INTRAMUSCULAR | Status: AC
  Filled 2015-11-26: qty 2

## 2015-11-26 MED ORDER — LIDOCAINE HCL (CARDIAC) 20 MG/ML IV SOLN
INTRAVENOUS | Status: AC
  Filled 2015-11-26: qty 15

## 2015-11-26 MED ORDER — ONDANSETRON HCL 4 MG/2ML IJ SOLN
INTRAMUSCULAR | Status: AC
  Filled 2015-11-26: qty 2

## 2015-11-26 MED ORDER — METHOCARBAMOL 1000 MG/10ML IJ SOLN
500.0000 mg | Freq: Four times a day (QID) | INTRAVENOUS | Status: DC | PRN
  Filled 2015-11-26: qty 5

## 2015-11-26 MED ORDER — SODIUM CHLORIDE 0.9 % IR SOLN
Status: DC | PRN
  Administered 2015-11-26: 500 mL

## 2015-11-26 MED ORDER — GLYCOPYRROLATE 0.2 MG/ML IJ SOLN
INTRAMUSCULAR | Status: DC | PRN
  Administered 2015-11-26: 0.6 mg via INTRAVENOUS

## 2015-11-26 MED ORDER — HEMOSTATIC AGENTS (NO CHARGE) OPTIME
TOPICAL | Status: DC | PRN
  Administered 2015-11-26: 1 via TOPICAL

## 2015-11-26 MED ORDER — PROMETHAZINE HCL 25 MG/ML IJ SOLN: 6.2500 mg | INTRAMUSCULAR | Status: DC | PRN

## 2015-11-26 MED ORDER — HYDROCHLOROTHIAZIDE 12.5 MG PO CAPS
12.5000 mg | ORAL_CAPSULE | Freq: Every day | ORAL | Status: DC
  Administered 2015-11-26: 12.5 mg via ORAL
  Filled 2015-11-26: qty 1

## 2015-11-26 MED ORDER — ARTIFICIAL TEARS OP OINT
TOPICAL_OINTMENT | OPHTHALMIC | Status: AC
  Filled 2015-11-26: qty 3.5

## 2015-11-26 MED ORDER — ALLOPURINOL 300 MG PO TABS
500.0000 mg | ORAL_TABLET | Freq: Every day | ORAL | Status: DC
  Filled 2015-11-26: qty 2

## 2015-11-26 MED ORDER — BUPIVACAINE HCL (PF) 0.25 % IJ SOLN
INTRAMUSCULAR | Status: DC | PRN
  Administered 2015-11-26: 6 mL

## 2015-11-26 MED ORDER — THROMBIN 5000 UNITS EX SOLR
OROMUCOSAL | Status: DC | PRN
  Administered 2015-11-26: 10 mL via TOPICAL

## 2015-11-26 MED ORDER — ROCURONIUM BROMIDE 100 MG/10ML IV SOLN
INTRAVENOUS | Status: DC | PRN
  Administered 2015-11-26: 50 mg via INTRAVENOUS

## 2015-11-26 MED ORDER — METFORMIN HCL 500 MG PO TABS
500.0000 mg | ORAL_TABLET | Freq: Three times a day (TID) | ORAL | Status: DC
  Administered 2015-11-26 – 2015-11-27 (×3): 500 mg via ORAL
  Filled 2015-11-26 (×3): qty 1

## 2015-11-26 MED ORDER — PHENOL 1.4 % MT LIQD
1.0000 | OROMUCOSAL | Status: DC | PRN
  Administered 2015-11-27: 1 via OROMUCOSAL
  Filled 2015-11-26: qty 177

## 2015-11-26 MED ORDER — NEOSTIGMINE METHYLSULFATE 10 MG/10ML IV SOLN
INTRAVENOUS | Status: AC
  Filled 2015-11-26: qty 2

## 2015-11-26 MED ORDER — SODIUM CHLORIDE 0.9 % IV SOLN: 250.0000 mL | INTRAVENOUS | Status: DC

## 2015-11-26 MED ORDER — LACTATED RINGERS IV SOLN
INTRAVENOUS | Status: DC
  Administered 2015-11-26: 09:00:00 via INTRAVENOUS

## 2015-11-26 MED ORDER — ACETAMINOPHEN 325 MG PO TABS: 650.0000 mg | ORAL_TABLET | ORAL | Status: DC | PRN

## 2015-11-26 MED ORDER — ONDANSETRON HCL 4 MG/2ML IJ SOLN: 4.0000 mg | INTRAMUSCULAR | Status: DC | PRN

## 2015-11-26 MED ORDER — PHENYLEPHRINE HCL 10 MG/ML IJ SOLN
INTRAMUSCULAR | Status: DC | PRN
  Administered 2015-11-26: 40 ug via INTRAVENOUS

## 2015-11-26 MED ORDER — METHOCARBAMOL 500 MG PO TABS
ORAL_TABLET | ORAL | Status: AC
Start: 2015-11-26 — End: 2015-11-27
  Filled 2015-11-26: qty 1

## 2015-11-26 MED ORDER — HYDROMORPHONE HCL 1 MG/ML IJ SOLN
INTRAMUSCULAR | Status: AC
  Filled 2015-11-26: qty 1

## 2015-11-26 MED ORDER — THROMBIN 5000 UNITS EX SOLR
CUTANEOUS | Status: DC | PRN
  Administered 2015-11-26 (×2): 5000 [IU] via TOPICAL

## 2015-11-26 MED ORDER — MENTHOL 3 MG MT LOZG: 1.0000 | LOZENGE | OROMUCOSAL | Status: DC | PRN

## 2015-11-26 MED ORDER — ONDANSETRON HCL 4 MG/2ML IJ SOLN
INTRAMUSCULAR | Status: DC | PRN
  Administered 2015-11-26: 4 mg via INTRAVENOUS

## 2015-11-26 MED ORDER — ALLOPURINOL 300 MG PO TABS: 300.0000 mg | ORAL_TABLET | Freq: Every day | ORAL | Status: DC

## 2015-11-26 MED ORDER — CEFAZOLIN SODIUM 1-5 GM-% IV SOLN
1.0000 g | Freq: Three times a day (TID) | INTRAVENOUS | Status: AC
  Administered 2015-11-26 – 2015-11-27 (×2): 1 g via INTRAVENOUS
  Filled 2015-11-26 (×2): qty 50

## 2015-11-26 MED ORDER — ACETAMINOPHEN 650 MG RE SUPP: 650.0000 mg | RECTAL | Status: DC | PRN

## 2015-11-26 MED ORDER — ALLOPURINOL 100 MG PO TABS: 200.0000 mg | ORAL_TABLET | Freq: Every day | ORAL | Status: DC

## 2015-11-26 MED ORDER — PROPOFOL 10 MG/ML IV BOLUS
INTRAVENOUS | Status: AC
  Filled 2015-11-26: qty 20

## 2015-11-26 MED ORDER — OXYCODONE-ACETAMINOPHEN 5-325 MG PO TABS
1.0000 | ORAL_TABLET | ORAL | Status: DC | PRN
  Administered 2015-11-26 – 2015-11-27 (×5): 2 via ORAL
  Filled 2015-11-26 (×5): qty 2

## 2015-11-26 SURGICAL SUPPLY — 47 items
APL SKNCLS STERI-STRIP NONHPOA (GAUZE/BANDAGES/DRESSINGS) ×1
BAG DECANTER FOR FLEXI CONT (MISCELLANEOUS) ×2 IMPLANT
BENZOIN TINCTURE PRP APPL 2/3 (GAUZE/BANDAGES/DRESSINGS) ×2 IMPLANT
BIT DRILL POWER (BIT) IMPLANT
BONE CERV LORDOTIC 14.5X12X9 (Bone Implant) ×2 IMPLANT
BUR MATCHSTICK NEURO 3.0 LAGG (BURR) ×2 IMPLANT
CANISTER SUCT 3000ML PPV (MISCELLANEOUS) ×2 IMPLANT
DRAPE C-ARM 42X72 X-RAY (DRAPES) ×4 IMPLANT
DRAPE LAPAROTOMY 100X72 PEDS (DRAPES) ×2 IMPLANT
DRAPE MICROSCOPE LEICA (MISCELLANEOUS) ×2 IMPLANT
DRAPE POUCH INSTRU U-SHP 10X18 (DRAPES) ×2 IMPLANT
DRILL BIT POWER (BIT) ×2
DRSG OPSITE POSTOP 3X4 (GAUZE/BANDAGES/DRESSINGS) ×1 IMPLANT
DURAPREP 6ML APPLICATOR 50/CS (WOUND CARE) ×2 IMPLANT
ELECT COATED BLADE 2.86 ST (ELECTRODE) ×2 IMPLANT
ELECT REM PT RETURN 9FT ADLT (ELECTROSURGICAL) ×2
ELECTRODE REM PT RTRN 9FT ADLT (ELECTROSURGICAL) ×1 IMPLANT
GAUZE SPONGE 4X4 16PLY XRAY LF (GAUZE/BANDAGES/DRESSINGS) IMPLANT
GLOVE BIO SURGEON STRL SZ8 (GLOVE) ×2 IMPLANT
GOWN STRL REUS W/ TWL LRG LVL3 (GOWN DISPOSABLE) IMPLANT
GOWN STRL REUS W/ TWL XL LVL3 (GOWN DISPOSABLE) IMPLANT
GOWN STRL REUS W/TWL 2XL LVL3 (GOWN DISPOSABLE) ×2 IMPLANT
GOWN STRL REUS W/TWL LRG LVL3 (GOWN DISPOSABLE)
GOWN STRL REUS W/TWL XL LVL3 (GOWN DISPOSABLE)
GRAFT BNE SPCR VG2 14.5X12X9 (Bone Implant) IMPLANT
HEMOSTAT POWDER KIT SURGIFOAM (HEMOSTASIS) ×2 IMPLANT
KIT BASIN OR (CUSTOM PROCEDURE TRAY) ×2 IMPLANT
KIT ROOM TURNOVER OR (KITS) ×2 IMPLANT
NDL HYPO 25X1 1.5 SAFETY (NEEDLE) ×1 IMPLANT
NDL SPNL 20GX3.5 QUINCKE YW (NEEDLE) ×1 IMPLANT
NEEDLE HYPO 25X1 1.5 SAFETY (NEEDLE) ×2 IMPLANT
NEEDLE SPNL 20GX3.5 QUINCKE YW (NEEDLE) ×2 IMPLANT
NS IRRIG 1000ML POUR BTL (IV SOLUTION) ×2 IMPLANT
PACK LAMINECTOMY NEURO (CUSTOM PROCEDURE TRAY) ×2 IMPLANT
PAD ARMBOARD 7.5X6 YLW CONV (MISCELLANEOUS) ×2 IMPLANT
PLATE ARCHON 24MM 1LVL (Plate) ×1 IMPLANT
RUBBERBAND STERILE (MISCELLANEOUS) ×4 IMPLANT
SCREW ARCHON SELFTAP 4.0X13 (Screw) ×4 IMPLANT
SPONGE INTESTINAL PEANUT (DISPOSABLE) ×2 IMPLANT
SPONGE SURGIFOAM ABS GEL SZ50 (HEMOSTASIS) ×2 IMPLANT
STRIP CLOSURE SKIN 1/2X4 (GAUZE/BANDAGES/DRESSINGS) ×2 IMPLANT
SUT VIC AB 3-0 SH 8-18 (SUTURE) ×3 IMPLANT
TAPE STRIPS DRAPE STRL (GAUZE/BANDAGES/DRESSINGS) ×1 IMPLANT
TOWEL OR 17X24 6PK STRL BLUE (TOWEL DISPOSABLE) ×2 IMPLANT
TOWEL OR 17X26 10 PK STRL BLUE (TOWEL DISPOSABLE) ×2 IMPLANT
TRAP SPECIMEN MUCOUS 40CC (MISCELLANEOUS) IMPLANT
WATER STERILE IRR 1000ML POUR (IV SOLUTION) ×2 IMPLANT

## 2015-11-26 NOTE — Anesthesia Postprocedure Evaluation (Signed)
Anesthesia Post Note  Patient: John Bonilla  Procedure(s) Performed: Procedure(s) (LRB): C5-6 ANTERIOR CERVICAL DECOMPRESSION/DISCECTOMY FUSION  (N/A)  Patient location during evaluation: PACU Anesthesia Type: General Level of consciousness: awake and alert Pain management: pain level controlled Vital Signs Assessment: post-procedure vital signs reviewed and stable Respiratory status: spontaneous breathing Cardiovascular status: blood pressure returned to baseline Anesthetic complications: no    Last Vitals:  Filed Vitals:   11/26/15 1300 11/26/15 1322  BP: 142/86 147/85  Pulse: 64 77  Temp: 36.6 C 36.5 C  Resp: 14 16    Last Pain:  Filed Vitals:   11/26/15 1334  PainSc: 5                  Tiajuana Amass

## 2015-11-26 NOTE — Anesthesia Preprocedure Evaluation (Addendum)
Anesthesia Evaluation  Patient identified by MRN, date of birth, ID band Patient awake    Reviewed: Allergy & Precautions, NPO status , Patient's Chart, lab work & pertinent test results  Airway Mallampati: II  TM Distance: >3 FB Neck ROM: Full    Dental  (+) Dental Advisory Given   Pulmonary sleep apnea , former smoker,    breath sounds clear to auscultation       Cardiovascular hypertension, Pt. on medications  Rhythm:Regular Rate:Normal     Neuro/Psych negative neurological ROS     GI/Hepatic Neg liver ROS, hiatal hernia, GERD  ,  Endo/Other  diabetes, Type 2, Oral Hypoglycemic AgentsMorbid obesity  Renal/GU negative Renal ROS     Musculoskeletal  (+) Arthritis ,   Abdominal   Peds  Hematology negative hematology ROS (+)   Anesthesia Other Findings   Reproductive/Obstetrics                               Component Value Date/Time   WBC 6.1 11/19/2015 0931   WBC 7.7 03/24/2015   RBC 4.15* 11/19/2015 0931   HGB 14.6 11/19/2015 0931   HCT 42.4 11/19/2015 0931   PLT 223 11/19/2015 0931   LABPROT 14.2 11/19/2015 0931   INR 1.08 11/19/2015 0931      Component Value Date/Time   NA 139 11/19/2015 0931   NA 138 03/24/2015   K 4.0 11/19/2015 0931   CL 101 11/19/2015 0931   CO2 28 11/19/2015 0931   BUN 15 11/19/2015 0931   BUN 14 03/24/2015   CREATININE 0.81 11/19/2015 0931   CREATININE 1.0 03/24/2015   GLU 184 03/24/2015   CALCIUM 9.7 11/19/2015 0931   ALKPHOS 50 11/14/2015 1220   AST 27 11/14/2015 1220   ALT 34 11/14/2015 1220   BILITOT 0.6 11/14/2015 1220      Anesthesia Physical Anesthesia Plan  ASA: III  Anesthesia Plan: General   Post-op Pain Management:    Induction: Intravenous  Airway Management Planned: Oral ETT  Additional Equipment:   Intra-op Plan:   Post-operative Plan: Extubation in OR  Informed Consent: I have reviewed the patients History and  Physical, chart, labs and discussed the procedure including the risks, benefits and alternatives for the proposed anesthesia with the patient or authorized representative who has indicated his/her understanding and acceptance.     Plan Discussed with: CRNA  Anesthesia Plan Comments:         Anesthesia Quick Evaluation

## 2015-11-26 NOTE — H&P (Signed)
Subjective:   Patient is a 69 y.o. male admitted for neck and arm pain. The patient first presented to me with complaints of neck pain, shooting pains in the arm(s) and numbness of the arm(s). Onset of symptoms was several months ago. The pain is described as aching and occurs all day. The pain is rated severe, and is located  in the neck and radiates to the arms. The symptoms have been progressive. Symptoms are exacerbated by extending head backwards, and are relieved by none.  Previous work up includes MRI of cervical spine, results: spinal stenosis.  Past Medical History  Diagnosis Date  . Arthritis   . Diabetes mellitus without complication ([place])   . Hyperlipidemia   . Hypertension   . Morbid obesity ([place])   . Sleep apnea     CPAP   . Shortness of breath dyspnea     WITH EXERTION   . History of kidney stones   . History of hiatal hernia   . GERD (gastroesophageal reflux disease)     RESOLVED   . Cancer ([place])     BLADDER+ SKIN     Past Surgical History  Procedure Laterality Date  . Laparoscopic gastric banding    . Cystoscopy  01/2015  . Cataract extraction w/ intraocular lens  implant, bilateral      No Known Allergies  Social History  Substance Use Topics  . Smoking status: Former Smoker    Quit date: 12/13/1988  . Smokeless tobacco: Never Used  . Alcohol Use: Yes     Comment: RARELY     Family History  Problem Relation Age of Onset  . Cancer Mother 90    liver/lung  . Heart disease Father 52  . Cancer Sister     lung   Prior to Admission medications   Medication Sig Start Date End Date Taking? Authorizing Provider  allopurinol (ZYLOPRIM) 100 MG tablet Take 200 mg by mouth daily.   Yes Historical Provider, MD  allopurinol (ZYLOPRIM) 300 MG tablet Take 1 tablet (300 mg total) by mouth daily. Pt is due for annual after post op clearance 11/07/15  Yes Midge Minium, MD  aspirin 81 MG tablet Take 81 mg by mouth daily.   Yes Historical Provider, MD  colchicine  0.6 MG tablet Take 0.6 mg by mouth daily.   Yes Historical Provider, MD  diclofenac sodium (VOLTAREN) 1 % GEL Apply 2 g topically daily as needed (pain).    Yes Historical Provider, MD  gabapentin (NEURONTIN) 300 MG capsule Take 300 mg by mouth 3 (three) times daily.    Yes Historical Provider, MD  glimepiride (AMARYL) 1 MG tablet Take 1 tablet (1 mg total) by mouth daily with breakfast. 11/24/15  Yes Midge Minium, MD  hydrochlorothiazide (HYDRODIURIL) 12.5 MG tablet Take 1 tablet (12.5 mg total) by mouth daily. 04/29/15  Yes Midge Minium, MD  meloxicam (MOBIC) 15 MG tablet Take 15 mg by mouth daily.  09/16/15  Yes Historical Provider, MD  metFORMIN (GLUCOPHAGE) 500 MG tablet Take 1 tablet (500 mg total) by mouth 4 (four) times daily. 11/07/15  Yes Midge Minium, MD  Multiple Vitamin (MULTIVITAMIN WITH MINERALS) TABS tablet Take 1 tablet by mouth daily.   Yes Historical Provider, MD  pravastatin (PRAVACHOL) 40 MG tablet Take 1 tablet (40 mg total) by mouth daily. 11/07/15  Yes Midge Minium, MD  quinapril (ACCUPRIL) 40 MG tablet Take 1 tablet (40 mg total) by mouth daily. 11/07/15  Yes Belenda Cruise  Wadie Lessen, MD  vitamin E 400 UNIT capsule Take 400 Units by mouth daily.   Yes Historical Provider, MD     Review of Systems  Positive ROS: neg  All other systems have been reviewed and were otherwise negative with the exception of those mentioned in the HPI and as above.  Objective: Vital signs in last 24 hours: Temp:  [97.9 F (36.6 C)] 97.9 F (36.6 C) (12/14 0903) Pulse Rate:  [72] 72 (12/14 0903) Resp:  [18] 18 (12/14 0903) BP: (152)/(91) 152/91 mmHg (12/14 0903) SpO2:  [97 %] 97 % (12/14 0903)  General Appearance: Alert, cooperative, no distress, appears stated age Head: Normocephalic, without obvious abnormality, atraumatic Eyes: PERRL, conjunctiva/corneas clear, EOM's intact      Neck: Supple, symmetrical, trachea midline, Back: Symmetric, no curvature, ROM normal,  no CVA tenderness Lungs:  respirations unlabored Heart: Regular rate and rhythm Abdomen: Soft, non-tender Extremities: Extremities normal, atraumatic, no cyanosis or edema Pulses: 2+ and symmetric all extremities Skin: Skin color, texture, turgor normal, no rashes or lesions  NEUROLOGIC:  Mental status: Alert and oriented x4, no aphasia, good attention span, fund of knowledge and memory  Motor Exam - grossly normal Sensory Exam - grossly normal Reflexes: 1+ Coordination - grossly normal Gait - grossly normal Balance - grossly normal Cranial Nerves: I: smell Not tested  II: visual acuity  OS: nl    OD: nl  II: visual fields Full to confrontation  II: pupils Equal, round, reactive to light  III,VII: ptosis None  III,IV,VI: extraocular muscles  Full ROM  V: mastication Normal  V: facial light touch sensation  Normal  V,VII: corneal reflex  Present  VII: facial muscle function - upper  Normal  VII: facial muscle function - lower Normal  VIII: hearing Not tested  IX: soft palate elevation  Normal  IX,X: gag reflex Present  XI: trapezius strength  5/5  XI: sternocleidomastoid strength 5/5  XI: neck flexion strength  5/5  XII: tongue strength  Normal    Data Review Lab Results  Component Value Date   WBC 6.1 11/19/2015   HGB 14.6 11/19/2015   HCT 42.4 11/19/2015   MCV 102.2* 11/19/2015   PLT 223 11/19/2015   Lab Results  Component Value Date   NA 139 11/19/2015   K 4.0 11/19/2015   CL 101 11/19/2015   CO2 28 11/19/2015   BUN 15 11/19/2015   CREATININE 0.81 11/19/2015   GLUCOSE 253* 11/19/2015   Lab Results  Component Value Date   INR 1.08 11/19/2015    Assessment:   Cervical neck pain with herniated nucleus pulposus/ spondylosis/ stenosis at C5-6. Patient has failed conservative therapy. Planned surgery : ACDF with plate C5-6  Plan:   I explained the condition and procedure to the patient and answered any questions.  Patient wishes to proceed with  procedure as planned. Understands risks/ benefits/ and expected or typical outcomes.  Karene Bracken S 11/26/2015 9:58 AM

## 2015-11-26 NOTE — Op Note (Signed)
11/26/2015  12:06 PM  PATIENT:  John Bonilla  69 y.o. 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 Decompressive anterior cervical discectomy C5-6, 2. Anterior cervical arthrodesis C5-6 utilizing an allograft, 3. Anterior plating C5-6 utilizing a Nuvasive archon plate  SURGEON:  Sherley Bounds, MD  ASSISTANTS: Dr. Sherwood Gambler  ANESTHESIA:   General  EBL: 75 ml  Total I/O In: 1100 [I.V.:1100] Out: 75 [Blood:75]  BLOOD ADMINISTERED:none  DRAINS: None   SPECIMEN:  No Specimen  INDICATION FOR PROCEDURE: This patient presented with neck and bilateral arm pain left greater than right. MRI showed spinal stenosis at C5-6. He tried medical management without relief. His pain was interfering with his quality of life and activities of daily living. Patient understood the risks, benefits, and alternatives and potential outcomes and wished to proceed.  PROCEDURE DETAILS: Patient was brought to the operating room placed under general endotracheal anesthesia. Patient was placed in the supine position on the operating room table. The neck was prepped with Duraprep and draped in a sterile fashion.   Three cc of local anesthesia was injected and a transverse incision was made on the right side of the neck.  Dissection was carried down thru the subcutaneous tissue and the platysma was  elevated, opened, and undermined with Metzenbaum scissors.  Dissection was then carried out thru an avascular plane leaving the sternocleidomastoid carotid artery and jugular vein laterally and the trachea and esophagus medially. The ventral aspect of the vertebral column was identified and a localizing x-ray was taken. The C5-6 level was identified. The longus colli muscles were then elevated and the retractor was placed. The annulus was incised and the disc space entered. Discectomy was performed  with micro-curettes and pituitary rongeurs. I then used the high-speed drill to drill the endplates down to the level of the posterior longitudinal ligament. The drill shavings were saved in a mucous trap for later arthrodesis. The operating microscope was draped and brought into the field provided additional magnification, illumination and visualization. Discectomy was continued posteriorly thru the disc space. Posterior longitudinal ligament was opened with a nerve hook, and then removed along with disc herniation and osteophytes, decompressing the spinal canal and thecal sac. We then continued to remove osteophytic overgrowth and disc material decompressing the neural foramina and exiting nerve roots bilaterally. The scope was angled up and down to help decompress and undercut the vertebral bodies. Once the decompression was completed we could pass a nerve hook circumferentially to assure adequate decompression in the midline and in the neural foramina. So by both visualization and palpation we felt we had an adequate decompression of the neural elements. We then measured the height of the intravertebral disc space and selected a 9 millimeter  allograft. It was then gently positioned in the intravertebral disc space and countersunk. I then used a 26 mmplaced four variable angle screws into the vertebral bodies and locked them into position. The wound was irrigated with bacitracin solution, checked for hemostasis which was established and confirmed. Once meticulous hemostasis was achieved, we then proceeded with closure. The platysma was closed with interrupted 3-0 undyed Vicryl suture, the subcuticular layer was closed with interrupted 3-0 undyed Vicryl suture. The skin edges were approximated with steristrips. The drapes were removed. A sterile dressing was applied. The patient was then awakened from general anesthesia and transferred to the recovery room in stable condition. At  the end of the procedure all  sponge, needle and instrument counts were correct.   PLAN OF CARE: Admit to inpatient   PATIENT DISPOSITION:  PACU - hemodynamically stable.   Delay start of Pharmacological VTE agent (>24hrs) due to surgical blood loss or risk of bleeding:  yes

## 2015-11-26 NOTE — Transfer of Care (Signed)
Immediate Anesthesia Transfer of Care Note  Patient: John Bonilla  Procedure(s) Performed: Procedure(s) with comments: C5-6 ANTERIOR CERVICAL DECOMPRESSION/DISCECTOMY FUSION  (N/A) - C5-6 ANTERIOR CERVICAL DECOMPRESSION/DISCECTOMY FUSION   Patient Location: PACU  Anesthesia Type:General  Level of Consciousness: awake, alert , oriented and patient cooperative  Airway & Oxygen Therapy: Patient Spontanous Breathing and Patient connected to face mask oxygen  Post-op Assessment: Report given to RN, Post -op Vital signs reviewed and stable and Patient moving all extremities X 4  Post vital signs: Reviewed and stable  Last Vitals:  Filed Vitals:   11/26/15 0903  BP: 152/91  Pulse: 72  Temp: 36.6 C  Resp: 18    Complications: No apparent anesthesia complications

## 2015-11-26 NOTE — Progress Notes (Signed)
Utilization review completed.  

## 2015-11-26 NOTE — Anesthesia Procedure Notes (Signed)
Procedure Name: Intubation Date/Time: 11/26/2015 10:52 AM Performed by: Maude Leriche D Pre-anesthesia Checklist: Patient identified, Emergency Drugs available, Suction available, Patient being monitored and Timeout performed Patient Re-evaluated:Patient Re-evaluated prior to inductionOxygen Delivery Method: Circle system utilized Preoxygenation: Pre-oxygenation with 100% oxygen Intubation Type: IV induction Ventilation: Mask ventilation without difficulty and Oral airway inserted - appropriate to patient size Laryngoscope Size: Miller and 2 Grade View: Grade II Tube type: Oral Tube size: 7.5 mm Number of attempts: 1 Airway Equipment and Method: Stylet Placement Confirmation: ETT inserted through vocal cords under direct vision,  positive ETCO2 and breath sounds checked- equal and bilateral Secured at: 22 cm Tube secured with: Tape Dental Injury: Teeth and Oropharynx as per pre-operative assessment

## 2015-11-27 ENCOUNTER — Encounter (HOSPITAL_COMMUNITY): Payer: Self-pay | Admitting: Neurological Surgery

## 2015-11-27 LAB — GLUCOSE, CAPILLARY: GLUCOSE-CAPILLARY: 145 mg/dL — AB (ref 65–99)

## 2015-11-27 MED ORDER — OXYCODONE-ACETAMINOPHEN 5-325 MG PO TABS: 1.0000 | ORAL_TABLET | Freq: Four times a day (QID) | ORAL | Status: DC | PRN

## 2015-11-27 NOTE — Progress Notes (Signed)
Pt doing well. Pt and wife given D/C instructions with Rx's, verbal understanding was provided. Pt's IV was removed prior to D/C. Pt's incision is covered with Honeycomb dressing and has a scant amount of dressing. Pt D/C'd home via wheelchair @ 1105 per MD order. Pt is stable @ D/C and has no other needs at this time. Holli Humbles, RN

## 2015-11-27 NOTE — Discharge Instructions (Signed)
Wound Care Keep incision covered and dry for one week.  If you shower prior to then, cover incision with plastic wrap.  You may remove outer bandage after one week and shower.  Do not put any creams, lotions, or ointments on incision. Leave steri-strips on neck.  They will fall off by themselves. Activity Walk each and every day, increasing distance each day. No lifting greater than 5 lbs.  Avoid excessive neck motion. No driving for 2 weeks; may ride as a passenger locally. Wear neck brace at all times except when showering or otherwise instructed. Diet Resume your normal diet.  Return to Work Will be discussed at you follow up appointment. Call Your Doctor If Any of These Occur Redness, drainage, or swelling at the wound.  Temperature greater than 101 degrees. Severe pain not relieved by pain medication. Increased difficulty swallowing.  Incision starts to come apart. Follow Up Appt Call today for appointment in 1-2 weeks [id number]) or for problems.  If you have any hardware placed in your spine, you will need an x-ray before your appointment.  Anterior Cervical Diskectomy and Fusion Anterior cervical diskectomy and fusion is a surgery that is done on the neck (cervical spine) to take pressure off of the nerves or the spinal cord. It is performed through the front (anterior) part of the neck. During this surgery, the damaged disk that is causing pain, numbness, or weakness is removed. The area where the disk was removed is filled with a plastic spacer implant, a bone graft, or both. These implants and bone grafts take pressure off of the nerves and spinal cord by making more room for the nerves to leave the spine. LET [facility] CARE PROVIDER KNOW ABOUT:  Any allergies you have.  All medicines you are taking, including vitamins, herbs, eye drops, creams, and over-the-counter medicines.  Previous problems you or members of your family have had with the use of anesthetics.  Any  blood disorders you have.  Previous surgeries you have had.  Any medical conditions you may have. RISKS AND COMPLICATIONS Generally, this is a safe procedure. However, problems may occur, including:  Infection.  Bleeding with the possible need for blood transfusion.  Injury to surrounding structures, including nerves.  Leakage of fluid from the brain or spinal cord (cerebrospinal fluid).  Blood clots.  Temporary breathing difficulties after surgery. BEFORE THE PROCEDURE  Follow your health care provider's instructions about eating or drinking restrictions.  Ask your health care provider about:  Changing or stopping your regular medicines. This is especially important if you are taking diabetes medicines or blood thinners.  Taking medicines such as aspirin and ibuprofen. These medicines can thin your blood. Do not take these medicines before your procedure if your health care provider instructs you not to.  You may be given antibiotic medicines to help prevent infection.  Your incision site may be marked on your neck. PROCEDURE  An IV tube will be inserted into one of your veins.  You will be given one or more of the following:  A medicine that helps you relax (sedative).  A medicine that makes you fall asleep (general anesthetic).  A breathing tube will be placed.  Your neck will be cleaned with a germ-killing solution (antiseptic).  Your surgeon will make an incision on the front of your neck, usually within a skinfold line.  Your neck muscles will be spread apart, and the damaged disk and bone spurs will be removed.  The area where the disk  was removed will be filled with a small plastic spacer implant, a bone graft, or both.  Your surgeon may put metal plates and screws (hardware) in your neck. This helps to stabilize the surgical site and keep implants and bone grafts in place. The hardware reduces motion at the surgical site so your bones can grow together  (fuse). This provides extra support to your neck.  The incision will be closed with stitches (sutures).  A bandage (dressing) will be applied to cover the incision. The procedure may vary among health care providers and hospitals. AFTER THE PROCEDURE  Your blood pressure, heart rate, breathing rate, and blood oxygen level will be monitored often until the medicines you were given have worn off.  You will be monitored for any signs of complications from the procedure, such as:  Too much bleeding from the incision site.  A buildup of blood under your skin at the surgical site.  Difficulty breathing.  You may continue to receive antibiotics.  You can start to eat as soon as you feel comfortable.  You may be given a neck brace to wear after surgery. This brace limits your neck movement while your bones are fusing together. Follow your health care provider's instructions about how often and how long you need to wear this.   This information is not intended to replace advice given to you by your health care provider. Make sure you discuss any questions you have with your health care provider.   Document Released: 11/17/2009 Document Revised: 12/20/2014 Document Reviewed: 11/17/2009 Elsevier Interactive Patient Education [employer].

## 2015-11-27 NOTE — Progress Notes (Signed)
Pt now refusing CPAP stating he does not like the loud noise from the exhalation port and is requesting RT to take cpap from the room

## 2015-11-27 NOTE — Discharge Summary (Signed)
Physician Discharge Summary  Patient ID: John Bonilla MRN: [id number] DOB/AGE: Apr 08, 1946 69 y.o.  Admit date: 11/26/2015 Discharge date: 11/27/2015  Admission Diagnoses: Cervical spinal stenosis C5-6   Discharge Diagnoses: Same   Discharged Condition: good  Hospital Course: The patient was admitted on 11/26/2015 and taken to the operating room where the patient underwent ACDF C5-6. The patient tolerated the procedure well and was taken to the recovery room and then to the floor in stable condition. The hospital course was routine. There were no complications. The wound remained clean dry and intact. Pt had appropriate neck soreness. No complaints of arm pain or new N/T. He did have some mild left arm ataxia and mild weakness. This was felt to be from neural irritation from the surgery. The patient remained afebrile with stable vital signs, and tolerated a regular diet. The patient continued to increase activities, and pain was well controlled with oral pain medications.   Consults: None  Significant Diagnostic Studies:  Results for orders placed or performed during the hospital encounter of 11/26/15  Glucose, capillary  Result Value Ref Range   Glucose-Capillary 172 (H) 65 - 99 mg/dL  Glucose, capillary  Result Value Ref Range   Glucose-Capillary 152 (H) 65 - 99 mg/dL   Comment 1 Notify RN    Comment 2 Document in Chart   Glucose, capillary  Result Value Ref Range   Glucose-Capillary 178 (H) 65 - 99 mg/dL  Glucose, capillary  Result Value Ref Range   Glucose-Capillary 176 (H) 65 - 99 mg/dL   Comment 1 Notify RN    Comment 2 Document in Chart     Chest 2 View  11/19/2015  CLINICAL DATA:  Cervical fusion.  Prior history of smoking. EXAM: CHEST  2 VIEW COMPARISON:  05/18/2014 . FINDINGS: Mediastinum hilar structures normal. Low lung volumes with mild bibasilar atelectasis. Stable calcified small pulmonary pulmonary nodules noted consistent granulomas. No pleural effusion  or pneumothorax. Stable cardiomegaly. No pulmonary venous congestion. IMPRESSION: 1.  Low lung volumes with mild bibasilar atelectasis. 2.  Stable cardiomegaly . Electronically Signed   By: Marcello Moores  Register   On: 11/19/2015 10:02   Dg Cervical Spine 2-3 Views  11/26/2015  CLINICAL DATA:  C5-C6 fusion, EXAM: DG C-ARM 61-120 MIN; CERVICAL SPINE - 2-3 VIEW COMPARISON:  None. FINDINGS: Single lateral view of the cervical spine submitted. There is anterior metallic fusion at [id number] level poorly visualized inferior aspect due to portable technique. IMPRESSION: Partially visualized anterior metallic fusion [id number] level. The alignment is preserved. Fluoroscopy time was 5 seconds.  Please see the operative report. Electronically Signed   By: Lahoma Crocker M.D.   On: 11/26/2015 12:21   Dg C-arm 1-60 Min  11/26/2015  CLINICAL DATA:  C5-C6 fusion, EXAM: DG C-ARM 61-120 MIN; CERVICAL SPINE - 2-3 VIEW COMPARISON:  None. FINDINGS: Single lateral view of the cervical spine submitted. There is anterior metallic fusion at [id number] level poorly visualized inferior aspect due to portable technique. IMPRESSION: Partially visualized anterior metallic fusion [id number] level. The alignment is preserved. Fluoroscopy time was 5 seconds.  Please see the operative report. Electronically Signed   By: Lahoma Crocker M.D.   On: 11/26/2015 12:21    Antibiotics:  Anti-infectives    Start     Dose/Rate Route Frequency Ordered Stop   11/26/15 1830  ceFAZolin (ANCEF) IVPB 1 g/50 mL premix     1 g 100 mL/hr over 30 Minutes Intravenous Every 8 hours 11/26/15 1329 11/27/15 0159   11/26/15  1130  ceFAZolin (ANCEF) 3 g in dextrose 5 % 50 mL IVPB     3 g 160 mL/hr over 30 Minutes Intravenous To [facility] 11/25/15 1226 11/26/15 1050   11/26/15 1107  bacitracin 50,000 Units in sodium chloride irrigation 0.9 % 500 mL irrigation  Status:  Discontinued       As needed 11/26/15 1107 11/26/15 1200      Discharge Exam: Blood pressure 125/62, pulse  74, temperature 98.5 F (36.9 C), temperature source Oral, resp. rate 20, SpO2 95 %. Neurologic: Grossly normal except for very mild weakness of the left bicep and deltoid Dressing dry  Discharge Medications:     Medication List    STOP taking these medications        meloxicam 15 MG tablet  Commonly known as:  MOBIC      TAKE these medications        allopurinol 100 MG tablet  Commonly known as:  ZYLOPRIM  Take 200 mg by mouth daily.     allopurinol 300 MG tablet  Commonly known as:  ZYLOPRIM  Take 1 tablet (300 mg total) by mouth daily. Pt is due for annual after post op clearance     aspirin 81 MG tablet  Take 81 mg by mouth daily.     colchicine 0.6 MG tablet  Take 0.6 mg by mouth daily.     gabapentin 300 MG capsule  Commonly known as:  NEURONTIN  Take 300 mg by mouth 3 (three) times daily.     glimepiride 1 MG tablet  Commonly known as:  AMARYL  Take 1 tablet (1 mg total) by mouth daily with breakfast.     hydrochlorothiazide 12.5 MG tablet  Commonly known as:  HYDRODIURIL  Take 1 tablet (12.5 mg total) by mouth daily.     metFORMIN 500 MG tablet  Commonly known as:  GLUCOPHAGE  Take 1 tablet (500 mg total) by mouth 4 (four) times daily.     multivitamin with minerals Tabs tablet  Take 1 tablet by mouth daily.     oxyCODONE-acetaminophen 5-325 MG tablet  Commonly known as:  PERCOCET/ROXICET  Take 1-2 tablets by mouth every 6 (six) hours as needed for moderate pain.     pravastatin 40 MG tablet  Commonly known as:  PRAVACHOL  Take 1 tablet (40 mg total) by mouth daily.     quinapril 40 MG tablet  Commonly known as:  ACCUPRIL  Take 1 tablet (40 mg total) by mouth daily.     vitamin E 400 UNIT capsule  Take 400 Units by mouth daily.     VOLTAREN 1 % Gel  Generic drug:  diclofenac sodium  Apply 2 g topically daily as needed (pain).        Disposition: Home   Final Dx: ACDF C5-6      Discharge Instructions    Call MD for:  difficulty  breathing, headache or visual disturbances    Complete by:  As directed      Call MD for:  persistant nausea and vomiting    Complete by:  As directed      Call MD for:  redness, tenderness, or signs of infection (pain, swelling, redness, odor or green/yellow discharge around incision site)    Complete by:  As directed      Call MD for:  severe uncontrolled pain    Complete by:  As directed      Call MD for:  temperature >100.4  Complete by:  As directed      Diet - low sodium heart healthy    Complete by:  As directed      Discharge instructions    Complete by:  As directed   No heavy lifting, no driving     Increase activity slowly    Complete by:  As directed      Remove dressing in 48 hours    Complete by:  As directed            Follow-up Information    Follow up with Rotha Cassels S, MD. Schedule an appointment as soon as possible for a visit in 2 weeks.   Specialty:  Neurosurgery   Contact information:   [address] [place] [address] [phone]        Signed: Eustace Moore 11/27/2015, 7:30 AM

## 2015-12-11 ENCOUNTER — Emergency Department (HOSPITAL_COMMUNITY): Payer: Commercial Managed Care - HMO

## 2015-12-11 ENCOUNTER — Emergency Department (HOSPITAL_COMMUNITY)
Admission: EM | Admit: 2015-12-11 | Discharge: 2015-12-11 | Disposition: A | Discharge status: Home / Self Care | Payer: Commercial Managed Care - HMO | Attending: Emergency Medicine | Admitting: Emergency Medicine

## 2015-12-11 ENCOUNTER — Encounter (HOSPITAL_COMMUNITY): Payer: Self-pay | Admitting: Cardiology

## 2015-12-11 DIAGNOSIS — M5412 Radiculopathy, cervical region: Secondary | ICD-10-CM | POA: Insufficient documentation

## 2015-12-11 DIAGNOSIS — E119 Type 2 diabetes mellitus without complications: Secondary | ICD-10-CM | POA: Insufficient documentation

## 2015-12-11 DIAGNOSIS — Z8551 Personal history of malignant neoplasm of bladder: Secondary | ICD-10-CM | POA: Insufficient documentation

## 2015-12-11 DIAGNOSIS — Z79899 Other long term (current) drug therapy: Secondary | ICD-10-CM | POA: Insufficient documentation

## 2015-12-11 DIAGNOSIS — M1990 Unspecified osteoarthritis, unspecified site: Secondary | ICD-10-CM | POA: Diagnosis not present

## 2015-12-11 DIAGNOSIS — E785 Hyperlipidemia, unspecified: Secondary | ICD-10-CM | POA: Insufficient documentation

## 2015-12-11 DIAGNOSIS — Z87891 Personal history of nicotine dependence: Secondary | ICD-10-CM | POA: Diagnosis not present

## 2015-12-11 DIAGNOSIS — R531 Weakness: Secondary | ICD-10-CM | POA: Diagnosis not present

## 2015-12-11 DIAGNOSIS — M542 Cervicalgia: Secondary | ICD-10-CM | POA: Diagnosis not present

## 2015-12-11 DIAGNOSIS — Z9981 Dependence on supplemental oxygen: Secondary | ICD-10-CM | POA: Diagnosis not present

## 2015-12-11 DIAGNOSIS — Z7982 Long term (current) use of aspirin: Secondary | ICD-10-CM | POA: Diagnosis not present

## 2015-12-11 DIAGNOSIS — Z87442 Personal history of urinary calculi: Secondary | ICD-10-CM | POA: Diagnosis not present

## 2015-12-11 DIAGNOSIS — Z8719 Personal history of other diseases of the digestive system: Secondary | ICD-10-CM | POA: Diagnosis not present

## 2015-12-11 DIAGNOSIS — G8918 Other acute postprocedural pain: Secondary | ICD-10-CM | POA: Insufficient documentation

## 2015-12-11 DIAGNOSIS — Z791 Long term (current) use of non-steroidal anti-inflammatories (NSAID): Secondary | ICD-10-CM | POA: Insufficient documentation

## 2015-12-11 DIAGNOSIS — G4730 Sleep apnea, unspecified: Secondary | ICD-10-CM | POA: Insufficient documentation

## 2015-12-11 DIAGNOSIS — I10 Essential (primary) hypertension: Secondary | ICD-10-CM | POA: Insufficient documentation

## 2015-12-11 DIAGNOSIS — Z7984 Long term (current) use of oral hypoglycemic drugs: Secondary | ICD-10-CM | POA: Diagnosis not present

## 2015-12-11 DIAGNOSIS — M79622 Pain in left upper arm: Secondary | ICD-10-CM | POA: Diagnosis present

## 2015-12-11 LAB — CBC WITH DIFFERENTIAL/PLATELET
Basophils Absolute: 0.1 10*3/uL (ref 0.0–0.1)
Basophils Relative: 1 %
EOS ABS: 0.7 10*3/uL (ref 0.0–0.7)
Eosinophils Relative: 10 %
HEMATOCRIT: 42.2 % (ref 39.0–52.0)
HEMOGLOBIN: 14.2 g/dL (ref 13.0–17.0)
LYMPHS ABS: 1.3 10*3/uL (ref 0.7–4.0)
Lymphocytes Relative: 19 %
MCH: 34.8 pg — AB (ref 26.0–34.0)
MCHC: 33.6 g/dL (ref 30.0–36.0)
MCV: 103.4 fL — AB (ref 78.0–100.0)
MONOS PCT: 8 %
Monocytes Absolute: 0.6 10*3/uL (ref 0.1–1.0)
Neutro Abs: 4.4 10*3/uL (ref 1.7–7.7)
Neutrophils Relative %: 62 %
Platelets: 215 10*3/uL (ref 150–400)
RBC: 4.08 MIL/uL — AB (ref 4.22–5.81)
RDW: 13.3 % (ref 11.5–15.5)
WBC: 7.1 10*3/uL (ref 4.0–10.5)

## 2015-12-11 LAB — TROPONIN I

## 2015-12-11 LAB — COMPREHENSIVE METABOLIC PANEL
ALK PHOS: 51 U/L (ref 38–126)
ALT: 29 U/L (ref 17–63)
ANION GAP: 11 (ref 5–15)
AST: 28 U/L (ref 15–41)
Albumin: 3.9 g/dL (ref 3.5–5.0)
BILIRUBIN TOTAL: 0.6 mg/dL (ref 0.3–1.2)
BUN: 16 mg/dL (ref 6–20)
CALCIUM: 9.2 mg/dL (ref 8.9–10.3)
CO2: 26 mmol/L (ref 22–32)
CREATININE: 0.85 mg/dL (ref 0.61–1.24)
Chloride: 102 mmol/L (ref 101–111)
Glucose, Bld: 110 mg/dL — ABNORMAL HIGH (ref 65–99)
Potassium: 3.8 mmol/L (ref 3.5–5.1)
SODIUM: 139 mmol/L (ref 135–145)
TOTAL PROTEIN: 6.6 g/dL (ref 6.5–8.1)

## 2015-12-11 MED ORDER — OXYCODONE-ACETAMINOPHEN 5-325 MG PO TABS: 1.0000 | ORAL_TABLET | ORAL | Status: DC | PRN

## 2015-12-11 MED ORDER — GADOBENATE DIMEGLUMINE 529 MG/ML IV SOLN
20.0000 mL | Freq: Once | INTRAVENOUS | Status: AC | PRN
  Administered 2015-12-11: 20 mL via INTRAVENOUS

## 2015-12-11 MED ORDER — FREESTYLE SYSTEM KIT: 1.0000 | PACK | Status: DC | PRN

## 2015-12-11 MED ORDER — ONDANSETRON HCL 4 MG/2ML IJ SOLN
4.0000 mg | Freq: Once | INTRAMUSCULAR | Status: DC
  Filled 2015-12-11: qty 2

## 2015-12-11 MED ORDER — DEXAMETHASONE SODIUM PHOSPHATE 10 MG/ML IJ SOLN
10.0000 mg | Freq: Once | INTRAMUSCULAR | Status: AC
  Administered 2015-12-11: 10 mg via INTRAVENOUS
  Filled 2015-12-11: qty 1

## 2015-12-11 MED ORDER — MORPHINE SULFATE (PF) 4 MG/ML IV SOLN
4.0000 mg | Freq: Once | INTRAVENOUS | Status: DC
Start: 2015-12-11 — End: 2015-12-11
  Filled 2015-12-11: qty 1

## 2015-12-11 MED ORDER — METHYLPREDNISOLONE 4 MG PO TBPK: ORAL_TABLET | ORAL | Status: DC

## 2015-12-11 NOTE — ED Notes (Signed)
EKG system in entire hospital intermittently working EKG completed.

## 2015-12-11 NOTE — ED Notes (Signed)
Pt reports he had disc surgery on Dec 14th and is still having pain and tingling to the left arm, and now is in his neck. Reports Dr. Ronnald Ramp did the surgery and was told to come in for evaluation.

## 2015-12-11 NOTE — ED Provider Notes (Signed)
CSN: [id number]     Arrival date & time 12/11/15  1049 History   First MD Initiated Contact with Patient 12/11/15 1131     Chief Complaint  Patient presents with  . Tingling  . Arm Pain     (Consider location/radiation/quality/duration/timing/severity/associated sxs/prior Treatment) HPI Comments: Patient is status post ACDF by Dr. Ronnald Ramp on December 14. He reports since then he's had severe upper extremity pain with worsening weakness and numbness in his left arm. Reports the pain radiates from his bilateral shoulders to his elbows right side is worse than left. He was not having this pain before the surgery. Endorses worsening weakness and numbness in his left arm as well as pain in the shoulder the elbow. He's taking Percocet at home but only last about 2 hours. He denies any fevers or vomiting. No chest pain or shortness of breath. No weakness in his legs. No numbness or tingling in his legs. No bowel or bladder incontinence. He called the neurosurgery office today and was sent to the ED. Patient also with history of diabetes, hyperlipidemia, hypertension, GERD  The history is provided by the patient and a relative.    Past Medical History  Diagnosis Date  . Arthritis   . Diabetes mellitus without complication ([place])   . Hyperlipidemia   . Hypertension   . Morbid obesity ([place])   . Sleep apnea     CPAP   . Shortness of breath dyspnea     WITH EXERTION   . History of kidney stones   . History of hiatal hernia   . GERD (gastroesophageal reflux disease)     RESOLVED   . Cancer ([place])     BLADDER+ SKIN    Past Surgical History  Procedure Laterality Date  . Laparoscopic gastric banding    . Cystoscopy  01/2015  . Cataract extraction w/ intraocular lens  implant, bilateral    . Anterior cervical decomp/discectomy fusion N/A 11/26/2015    Procedure: C5-6 ANTERIOR CERVICAL DECOMPRESSION/DISCECTOMY FUSION ;  Surgeon: Eustace Moore, MD;  Location: [facility];  Service: Neurosurgery;   Laterality: N/A;  C5-6 ANTERIOR CERVICAL DECOMPRESSION/DISCECTOMY FUSION    Family History  Problem Relation Age of Onset  . Cancer Mother 46    liver/lung  . Heart disease Father 28  . Cancer Sister     lung   Social History  Substance Use Topics  . Smoking status: Former Smoker    Quit date: 12/13/1988  . Smokeless tobacco: Never Used  . Alcohol Use: Yes     Comment: RARELY     Review of Systems  Constitutional: Negative for fever, activity change and appetite change.  Respiratory: Negative for cough, chest tightness and shortness of breath.   Cardiovascular: Negative for chest pain and leg swelling.  Gastrointestinal: Negative for nausea, vomiting and abdominal pain.  Genitourinary: Negative for dysuria and hematuria.  Musculoskeletal: Positive for myalgias, arthralgias and neck pain.  Skin: Negative for rash.  Neurological: Positive for weakness and numbness. Negative for dizziness, light-headedness and headaches.  A complete 10 system review of systems was obtained and all systems are negative except as noted in the HPI and PMH.      Allergies  Review of patient's allergies indicates no known allergies.  Home Medications   Prior to Admission medications   Medication Sig Start Date End Date Taking? Authorizing Provider  aspirin 81 MG tablet Take 81 mg by mouth at bedtime.    Yes Historical Provider, MD  colchicine  0.6 MG tablet Take 0.6 mg by mouth daily.   Yes Historical Provider, MD  diclofenac sodium (VOLTAREN) 1 % GEL Apply 2 g topically daily as needed (pain).    Yes Historical Provider, MD  diphenhydramine-acetaminophen (TYLENOL PM) 25-500 MG TABS tablet Take 1 tablet by mouth at bedtime as needed.   Yes Historical Provider, MD  gabapentin (NEURONTIN) 300 MG capsule Take 300 mg by mouth 2 (two) times daily.    Yes Historical Provider, MD  glimepiride (AMARYL) 1 MG tablet Take 1 tablet (1 mg total) by mouth daily with breakfast. 11/24/15  Yes Midge Minium,  MD  hydrochlorothiazide (HYDRODIURIL) 12.5 MG tablet Take 1 tablet (12.5 mg total) by mouth daily. 04/29/15  Yes Midge Minium, MD  meloxicam (MOBIC) 15 MG tablet Take 15 mg by mouth daily. 11/15/15  Yes Historical Provider, MD  Menthol, Topical Analgesic, (ICY HOT) 7.5 % (Roll) MISC Apply 1 each topically daily as needed.   Yes Historical Provider, MD  metFORMIN (GLUCOPHAGE) 500 MG tablet Take 1 tablet (500 mg total) by mouth 4 (four) times daily. Patient taking differently: Take 500 mg by mouth 2 (two) times daily with a meal.  11/07/15  Yes Midge Minium, MD  Multiple Vitamin (MULTIVITAMIN WITH MINERALS) TABS tablet Take 1 tablet by mouth daily.   Yes Historical Provider, MD  pravastatin (PRAVACHOL) 40 MG tablet Take 1 tablet (40 mg total) by mouth daily. 11/07/15  Yes Midge Minium, MD  quinapril (ACCUPRIL) 40 MG tablet Take 1 tablet (40 mg total) by mouth daily. 11/07/15  Yes Midge Minium, MD  vitamin E 400 UNIT capsule Take 400 Units by mouth daily.   Yes Historical Provider, MD  allopurinol (ZYLOPRIM) 100 MG tablet Take 200 mg by mouth daily.     Historical Provider, MD  allopurinol (ZYLOPRIM) 300 MG tablet Take 1 tablet (300 mg total) by mouth daily. Pt is due for annual after post op clearance Patient taking differently: Take 600 mg by mouth daily. Pt is due for annual after post op clearance 11/07/15   Midge Minium, MD  glucose monitoring kit (FREESTYLE) monitoring kit 1 each by Does not apply route as needed for other. 12/11/15   Ezequiel Essex, MD  methylPREDNISolone (MEDROL DOSEPAK) 4 MG TBPK tablet As directed 12/11/15   Ezequiel Essex, MD  oxyCODONE-acetaminophen (PERCOCET/ROXICET) 5-325 MG tablet Take 1 tablet by mouth every 4 (four) hours as needed for severe pain. 12/11/15   Ezequiel Essex, MD   BP 148/95 mmHg  Pulse 67  Temp(Src) 98.2 F (36.8 C) (Oral)  Resp 16  Wt 286 lb (129.729 kg)  SpO2 96% Physical Exam  Constitutional: He is oriented to  person, place, and time. He appears well-developed and well-nourished. No distress.  HENT:  Head: Normocephalic and atraumatic.  Mouth/Throat: Oropharynx is clear and moist. No oropharyngeal exudate.  Eyes: Conjunctivae and EOM are normal. Pupils are equal, round, and reactive to light.  Neck: Normal range of motion. Neck supple.  Well-healed anterior cervical surgical incision  Cardiovascular: Normal rate, regular rhythm, normal heart sounds and intact distal pulses.   No murmur heard. Pulmonary/Chest: Effort normal and breath sounds normal. No respiratory distress.  Abdominal: Soft. There is no tenderness. There is no rebound and no guarding.  Musculoskeletal: Normal range of motion. He exhibits tenderness. He exhibits no edema.  Neurological: He is alert and oriented to person, place, and time. No cranial nerve deficit. He exhibits normal muscle tone. Coordination normal.  Slightly  decreased grip strength on the left. Weakness of forearm flexion and extension the left. Intact radial pulses bilaterally. Full range of motion of bilateral elbows and shoulders. Decreased sensation to L hand and forearm. Cardinal hand movements intact bilaterally. 5/5 strength in lower extremities.  Skin: Skin is warm.  Psychiatric: He has a normal mood and affect. His behavior is normal.  Nursing note and vitals reviewed.   ED Course  Procedures (including critical care time) Labs Review Labs Reviewed  CBC WITH DIFFERENTIAL/PLATELET - Abnormal; Notable for the following:    RBC 4.08 (*)    MCV 103.4 (*)    MCH 34.8 (*)    All other components within normal limits  COMPREHENSIVE METABOLIC PANEL - Abnormal; Notable for the following:    Glucose, Bld 110 (*)    All other components within normal limits  TROPONIN I    Imaging Review Dg Cervical Spine Complete  12/11/2015  CLINICAL DATA:  Progressive pain and left upper extremity numbness after surgical fusion EXAM: CERVICAL SPINE - COMPLETE 4+ VIEW  COMPARISON:  October 14, 2015 cervical MRI FINDINGS: Frontal, lateral, open-mouth odontoid, and bilateral oblique views were obtained. The patient is status post anterior fusion at C5 and C6. The screw and plate fixation device appears intact. The right-sided screws deviate medially at C5 and C6 ; significance of this finding is uncertain. There is no fracture or spondylolisthesis. The prevertebral soft tissues and predental space regions are within normal limits. There is moderate disc space narrowing at C6-7. There is also mild disc space narrowing at C3-4 and C4-5. There is facet hypertrophy with exit foraminal narrowing at C3-4, C5-6, and C6-7,. No erosive change. There are foci of carotid artery calcification bilaterally. IMPRESSION: Postoperative change at C5 and C6. The screws of the fixation device on the right at C5 and C6 deviate medially. This significance of this finding is uncertain. Multilevel arthropathy. No fracture or spondylolisthesis. There are foci of calcification in each carotid artery. Electronically Signed   By: Lowella Grip III M.D.   On: 12/11/2015 13:18   Mr Cervical Spine W Wo Contrast  12/11/2015  CLINICAL DATA:  69 year old male status post cervical spine surgery on the fourteenth of this month. Numbness pain in weakness in the left upper extremity. Subsequent encounter. EXAM: MRI CERVICAL SPINE WITHOUT AND WITH CONTRAST TECHNIQUE: Multiplanar and multiecho pulse sequences of the cervical spine, to include the craniocervical junction and cervicothoracic junction, were obtained according to standard protocol without and with intravenous contrast. CONTRAST:  36m MULTIHANCE GADOBENATE DIMEGLUMINE 529 MG/ML IV SOLN COMPARISON:  Cervical spine radiographs 1252 hours today. Intraoperative images [id number] Preoperative cervical spine MRI 02/06/2015. FINDINGS: Study is intermittently degraded by motion artifact despite repeated imaging attempts. Increase straightening of cervical  lordosis compared to February. Sequelae of ACDF at C5-C6 with mild hardware susceptibility artifact. Small volume of fluid within that disc space. Trace associated prevertebral fluid, mostly just to the right and anterior of the interbody implant. No definite marrow edema or acute osseous abnormality. Following contrast, there is enhancement within the interbody space as well as trace enhancement of the posterior interspinous ligament at C5-C6. No other abnormal enhancement. Otherwise negative paraspinal soft tissues. Cervicomedullary junction is within normal limits. No cervical spinal cord signal abnormality identified. C2-C3:  Stable and negative. C3-C4: Stable right paracentral disc osteophyte complex. Stable mild facet hypertrophy. Chronic moderate to severe right C4 foraminal stenosis. C4-C5: Circumferential disc osteophyte complex with right greater than left facet and uncovertebral hypertrophy appears stable.  Stable borderline to mild spinal stenosis and moderate to severe right C5 foraminal stenosis. C5-C6: Interval ACDF. Improved thecal sac patency. Residual uncovertebral hypertrophy. C6 foramina appear not significantly changed since February. C6-C7: Stable disc osteophyte complex with left greater than right facet and uncovertebral hypertrophy. Stable moderate left C7 foraminal stenosis. C7-T1:  Stable, negative. No upper thoracic spinal stenosis. IMPRESSION: 1. Recent C5-C6 ACDF. Small volume fluid in mid discectomy space and trace prevertebral fluid most likely is postoperative in nature. Improved thecal sac patency at that level. 2. Other cervical levels appear stable since February. Electronically Signed   By: Genevie Ann M.D.   On: 12/11/2015 16:09   I have personally reviewed and evaluated these images and lab results as part of my medical decision-making.   EKG Interpretation   Date/Time:  Thursday December 11 2015 12:16:51 EST Ventricular Rate:  67 PR Interval:  185 QRS Duration: 93 QT  Interval:  392 QTC Calculation: 414 R Axis:     Text Interpretation:  Sinus rhythm RSR' in V1 or V2, right VCD or RVH No  significant change was found Confirmed by Wyvonnia Dusky  MD, Camilla [phone]) on  12/11/2015 2:22:03 PM      MDM   Final diagnoses:  Postoperative pain  Cervical radiculopathy   Patient status post ADCF December 14 presenting with worsening bilateral upper arm pain, weakness and numbness in left arm. No fever or vomiting. No chest pain or shortness of breath.  Discussed with Dr. Cyndy Freeze of neurosurgery. He agrees with MRI with and without contrast as well as plain films of the C-spine.  MRI results d/w Dr. Cyndy Freeze. He does not see any thing that needs emergent intervention. Recommended 10 mg of IV Decadron now and discharge on site Medrol Dosepak. Follow-up with Dr. Ronnald Ramp next week. Continue Pain medication at home.  Patient agrees with plan.  Has appointment with Dr. Ronnald Ramp on 1/3.  Advised to monitor blood sugars closely while on steroids.  Short course of additional percocet provided. Return precautions discussed.  ED ECG REPORT   Date: 12/11/2015  Rate: 66  Rhythm: normal sinus rhythm  QRS Axis: normal  Intervals: normal  ST/T Wave abnormalities: normal  Conduction Disutrbances:none  Narrative Interpretation:   Old EKG Reviewed: unchanged  I have personally reviewed the EKG tracing and agree with the computerized printout as noted.   Ezequiel Essex, MD 12/11/15 [phone]

## 2015-12-11 NOTE — Discharge Instructions (Signed)
Cervical Radiculopathy Take the steroids as prescribed. Remember that they may increase yourblood sugars and you should monitor them closely. Follow-up with Dr. Ronnald Ramp next week as scheduled. Return to the ED if you develop new or worsening symptoms. Cervical radiculopathy happens when a nerve in the neck (cervical nerve) is pinched or bruised. This condition can develop because of an injury or as part of the normal aging process. Pressure on the cervical nerves can cause pain or numbness that runs from the neck all the way down into the arm and fingers. Usually, this condition gets better with rest. Treatment may be needed if the condition does not improve.  CAUSES This condition may be caused by:  Injury.  Slipped (herniated) disk.  Muscle tightness in the neck because of overuse.  Arthritis.  Breakdown or degeneration in the bones and joints of the spine (spondylosis) due to aging.  Bone spurs that may develop near the cervical nerves. SYMPTOMS Symptoms of this condition include:  Pain that runs from the neck to the arm and hand. The pain can be severe or irritating. It may be worse when the neck is moved.  Numbness or weakness in the affected arm and hand. DIAGNOSIS This condition may be diagnosed based on symptoms, medical history, and a physical exam. You may also have tests, including:  X-rays.  CT scan.  MRI.  Electromyogram (EMG).  Nerve conduction tests. TREATMENT In many cases, treatment is not needed for this condition. With rest, the condition usually gets better over time. If treatment is needed, options may include:  Wearing a soft neck collar for short periods of time.  Physical therapy to strengthen your neck muscles.  Medicines, such as NSAIDs, oral corticosteroids, or spinal injections.  Surgery. This may be needed if other treatments do not help. Various types of surgery may be done depending on the cause of your problems. HOME CARE  INSTRUCTIONS Managing Pain  Take over-the-counter and prescription medicines only as told by your health care provider.  If directed, apply ice to the affected area.  Put ice in a plastic bag.  Place a towel between your skin and the bag.  Leave the ice on for 20 minutes, 2-3 times per day.  If ice does not help, you can try using heat. Take a warm shower or warm bath, or use a heat pack as told by your health care provider.  Try a gentle neck and shoulder massage to help relieve symptoms. Activity  Rest as needed. Follow instructions from your health care provider about any restrictions on activities.  Do stretching and strengthening exercises as told by your health care provider or physical therapist. General Instructions  If you were given a soft collar, wear it as told by your health care provider.  Use a flat pillow when you sleep.  Keep all follow-up visits as told by your health care provider. This is important. SEEK MEDICAL CARE IF:  Your condition does not improve with treatment. SEEK IMMEDIATE MEDICAL CARE IF:  Your pain gets much worse and cannot be controlled with medicines.  You have weakness or numbness in your hand, arm, face, or leg.  You have a high fever.  You have a stiff, rigid neck.  You lose control of your bowels or your bladder (have incontinence).  You have trouble with walking, balance, or speaking.   This information is not intended to replace advice given to you by your health care provider. Make sure you discuss any questions you have  with your health care provider.   Document Released: 08/24/2001 Document Revised: 08/20/2015 Document Reviewed: 01/23/2015 Elsevier Interactive Patient Education [employer].

## 2015-12-11 NOTE — ED Notes (Signed)
Called MRI who stated patient on schedule for MRI and will send for transport when ready.

## 2015-12-11 NOTE — ED Notes (Signed)
Patient states pain improved by resting in stretcher and currently does not want pain medication.  Pain improved 7/10 to 3/10 achy sharp. Doctor notified.

## 2015-12-22 ENCOUNTER — Encounter (HOSPITAL_COMMUNITY): Payer: Self-pay | Admitting: Neurological Surgery

## 2015-12-22 NOTE — OR Nursing (Signed)
OR record updated with correction to implant log.

## 2015-12-24 DIAGNOSIS — R531 Weakness: Secondary | ICD-10-CM | POA: Diagnosis not present

## 2015-12-24 DIAGNOSIS — M79602 Pain in left arm: Secondary | ICD-10-CM | POA: Diagnosis not present

## 2015-12-24 DIAGNOSIS — R278 Other lack of coordination: Secondary | ICD-10-CM | POA: Diagnosis not present

## 2015-12-30 DIAGNOSIS — M5412 Radiculopathy, cervical region: Secondary | ICD-10-CM | POA: Diagnosis not present

## 2015-12-30 DIAGNOSIS — M79602 Pain in left arm: Secondary | ICD-10-CM | POA: Diagnosis not present

## 2015-12-30 DIAGNOSIS — R531 Weakness: Secondary | ICD-10-CM | POA: Diagnosis not present

## 2015-12-30 DIAGNOSIS — R278 Other lack of coordination: Secondary | ICD-10-CM | POA: Diagnosis not present

## 2016-01-01 DIAGNOSIS — R531 Weakness: Secondary | ICD-10-CM | POA: Diagnosis not present

## 2016-01-01 DIAGNOSIS — R278 Other lack of coordination: Secondary | ICD-10-CM | POA: Diagnosis not present

## 2016-01-01 DIAGNOSIS — M79602 Pain in left arm: Secondary | ICD-10-CM | POA: Diagnosis not present

## 2016-01-06 DIAGNOSIS — R278 Other lack of coordination: Secondary | ICD-10-CM | POA: Diagnosis not present

## 2016-01-06 DIAGNOSIS — R531 Weakness: Secondary | ICD-10-CM | POA: Diagnosis not present

## 2016-01-06 DIAGNOSIS — M79602 Pain in left arm: Secondary | ICD-10-CM | POA: Diagnosis not present

## 2016-01-08 DIAGNOSIS — M79602 Pain in left arm: Secondary | ICD-10-CM | POA: Diagnosis not present

## 2016-01-08 DIAGNOSIS — R531 Weakness: Secondary | ICD-10-CM | POA: Diagnosis not present

## 2016-01-08 DIAGNOSIS — R278 Other lack of coordination: Secondary | ICD-10-CM | POA: Diagnosis not present

## 2016-01-13 DIAGNOSIS — R531 Weakness: Secondary | ICD-10-CM | POA: Diagnosis not present

## 2016-01-13 DIAGNOSIS — R278 Other lack of coordination: Secondary | ICD-10-CM | POA: Diagnosis not present

## 2016-01-13 DIAGNOSIS — M79602 Pain in left arm: Secondary | ICD-10-CM | POA: Diagnosis not present

## 2016-01-15 DIAGNOSIS — R531 Weakness: Secondary | ICD-10-CM | POA: Diagnosis not present

## 2016-01-15 DIAGNOSIS — R278 Other lack of coordination: Secondary | ICD-10-CM | POA: Diagnosis not present

## 2016-01-15 DIAGNOSIS — M79602 Pain in left arm: Secondary | ICD-10-CM | POA: Diagnosis not present

## 2016-01-22 ENCOUNTER — Telehealth: Payer: Self-pay | Admitting: Family Medicine

## 2016-01-22 DIAGNOSIS — Z8551 Personal history of malignant neoplasm of bladder: Secondary | ICD-10-CM

## 2016-01-22 DIAGNOSIS — M79602 Pain in left arm: Secondary | ICD-10-CM | POA: Diagnosis not present

## 2016-01-22 DIAGNOSIS — R278 Other lack of coordination: Secondary | ICD-10-CM | POA: Diagnosis not present

## 2016-01-22 DIAGNOSIS — R531 Weakness: Secondary | ICD-10-CM | POA: Diagnosis not present

## 2016-01-22 NOTE — Telephone Encounter (Signed)
Caller name:Self  Can be reached: [phone]    Reason for call: Patient needs referral to see Dr. Delfino Lovett Puschinsky @ [facility]. Patient has appt scheduled for 01/30/2016. Patient changed insurances and needs the [facility] [employer] referral.

## 2016-01-23 NOTE — Telephone Encounter (Signed)
[employer] # [id number]

## 2016-01-23 NOTE — Telephone Encounter (Signed)
This referral was placed today.  

## 2016-01-27 DIAGNOSIS — R278 Other lack of coordination: Secondary | ICD-10-CM | POA: Diagnosis not present

## 2016-01-27 DIAGNOSIS — M79602 Pain in left arm: Secondary | ICD-10-CM | POA: Diagnosis not present

## 2016-01-27 DIAGNOSIS — R531 Weakness: Secondary | ICD-10-CM | POA: Diagnosis not present

## 2016-02-03 DIAGNOSIS — R29898 Other symptoms and signs involving the musculoskeletal system: Secondary | ICD-10-CM | POA: Diagnosis not present

## 2016-02-03 DIAGNOSIS — N138 Other obstructive and reflux uropathy: Secondary | ICD-10-CM | POA: Diagnosis not present

## 2016-02-03 DIAGNOSIS — C679 Malignant neoplasm of bladder, unspecified: Secondary | ICD-10-CM | POA: Diagnosis not present

## 2016-02-03 DIAGNOSIS — N401 Enlarged prostate with lower urinary tract symptoms: Secondary | ICD-10-CM | POA: Diagnosis not present

## 2016-02-10 DIAGNOSIS — R531 Weakness: Secondary | ICD-10-CM | POA: Diagnosis not present

## 2016-02-10 DIAGNOSIS — R278 Other lack of coordination: Secondary | ICD-10-CM | POA: Diagnosis not present

## 2016-02-10 DIAGNOSIS — M79602 Pain in left arm: Secondary | ICD-10-CM | POA: Diagnosis not present

## 2016-02-12 DIAGNOSIS — M79602 Pain in left arm: Secondary | ICD-10-CM | POA: Diagnosis not present

## 2016-02-12 DIAGNOSIS — R278 Other lack of coordination: Secondary | ICD-10-CM | POA: Diagnosis not present

## 2016-02-12 DIAGNOSIS — R531 Weakness: Secondary | ICD-10-CM | POA: Diagnosis not present

## 2016-02-17 DIAGNOSIS — R278 Other lack of coordination: Secondary | ICD-10-CM | POA: Diagnosis not present

## 2016-02-17 DIAGNOSIS — R531 Weakness: Secondary | ICD-10-CM | POA: Diagnosis not present

## 2016-02-17 DIAGNOSIS — M79602 Pain in left arm: Secondary | ICD-10-CM | POA: Diagnosis not present

## 2016-02-17 NOTE — Telephone Encounter (Signed)
Pre visit call completed 

## 2016-02-20 DIAGNOSIS — N359 Urethral stricture, unspecified: Secondary | ICD-10-CM | POA: Diagnosis not present

## 2016-02-20 DIAGNOSIS — C679 Malignant neoplasm of bladder, unspecified: Secondary | ICD-10-CM | POA: Diagnosis not present

## 2016-02-20 DIAGNOSIS — K760 Fatty (change of) liver, not elsewhere classified: Secondary | ICD-10-CM | POA: Diagnosis not present

## 2016-02-24 DIAGNOSIS — M79602 Pain in left arm: Secondary | ICD-10-CM | POA: Diagnosis not present

## 2016-02-24 DIAGNOSIS — R531 Weakness: Secondary | ICD-10-CM | POA: Diagnosis not present

## 2016-02-24 DIAGNOSIS — R278 Other lack of coordination: Secondary | ICD-10-CM | POA: Diagnosis not present

## 2016-03-02 DIAGNOSIS — M79602 Pain in left arm: Secondary | ICD-10-CM | POA: Diagnosis not present

## 2016-03-02 DIAGNOSIS — R531 Weakness: Secondary | ICD-10-CM | POA: Diagnosis not present

## 2016-03-02 DIAGNOSIS — R278 Other lack of coordination: Secondary | ICD-10-CM | POA: Diagnosis not present

## 2016-03-04 ENCOUNTER — Encounter: Payer: Self-pay | Admitting: Family Medicine

## 2016-03-04 ENCOUNTER — Ambulatory Visit (INDEPENDENT_AMBULATORY_CARE_PROVIDER_SITE_OTHER): Payer: Commercial Managed Care - HMO | Admitting: Family Medicine

## 2016-03-04 VITALS — BP 124/78 | HR 70 | Temp 98.1°F | Resp 16 | Wt 286.2 lb

## 2016-03-04 DIAGNOSIS — E1140 Type 2 diabetes mellitus with diabetic neuropathy, unspecified: Secondary | ICD-10-CM | POA: Diagnosis not present

## 2016-03-04 LAB — BASIC METABOLIC PANEL
BUN: 17 mg/dL (ref 6–23)
CALCIUM: 9.8 mg/dL (ref 8.4–10.5)
CO2: 30 meq/L (ref 19–32)
Chloride: 98 mEq/L (ref 96–112)
Creatinine, Ser: 0.92 mg/dL (ref 0.40–1.50)
GFR: 86.58 mL/min (ref >60.00)
GLUCOSE: 165 mg/dL — AB (ref 70–99)
POTASSIUM: 3.8 meq/L (ref 3.5–5.1)
SODIUM: 139 meq/L (ref 135–145)

## 2016-03-04 LAB — HEMOGLOBIN A1C: Hgb A1c MFr Bld: 7.4 % — ABNORMAL HIGH (ref 4.6–6.5)

## 2016-03-04 MED ORDER — COLCHICINE 0.6 MG PO TABS: 0.6000 mg | ORAL_TABLET | Freq: Every day | ORAL | Status: DC

## 2016-03-04 NOTE — Assessment & Plan Note (Signed)
Chronic problem.  Tolerating medication w/o difficulty.  UTD on foot and eye exams.  On ACE for renal protection.  Stressed need for healthy diet and regular exercise.  Check labs.  Adjust meds prn

## 2016-03-04 NOTE — Progress Notes (Signed)
Pre visit review using our clinic review tool, if applicable. No additional management support is needed unless otherwise documented below in the visit note. 

## 2016-03-04 NOTE — Progress Notes (Signed)
   Subjective:    Patient ID: PAL KIGHTLINGER, male    DOB: 04/06/1946, 70 y.o.   MRN: [id number]  HPI DM- chronic problem, on Metformin and Amaryl.  On ACE for renal protection.  UTD on eye exam (due in July), foot exam.  Not checking sugars at home.  Rare symptomatic lows.  No CP, SOB, HAs, visual changes, abd pain, N/V, dizziness.  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    Review of Systems For ROS see HPI     Objective:   Physical Exam  Constitutional: He is oriented to person, place, and time. He appears well-developed and well-nourished. No distress.  obese  HENT:  Head: Normocephalic and atraumatic.  Eyes: Conjunctivae and EOM are normal. Pupils are equal, round, and reactive to light.  Neck: Normal range of motion. Neck supple. No thyromegaly present.  Cardiovascular: Normal rate, regular rhythm, normal heart sounds and intact distal pulses.   No murmur heard. Pulmonary/Chest: Effort normal and breath sounds normal. No respiratory distress.  Abdominal: Soft. Bowel sounds are normal. He exhibits no distension.  Musculoskeletal: He exhibits no edema.  Lymphadenopathy:    He has no cervical adenopathy.  Neurological: He is alert and oriented to person, place, and time. No cranial nerve deficit.  Skin: Skin is warm and dry.  Psychiatric: He has a normal mood and affect. His behavior is normal.  Vitals reviewed.         Assessment & Plan:

## 2016-03-04 NOTE — Patient Instructions (Signed)
Follow up in 3-4 months to recheck diabetes and cholesterol We'll notify you of your lab results and make any changes if needed Try and make healthy food choices and get regular activity Call with any questions or concerns Happy Spring!!!

## 2016-04-05 ENCOUNTER — Other Ambulatory Visit: Payer: Self-pay | Admitting: Family Medicine

## 2016-04-05 NOTE — Telephone Encounter (Signed)
Medication filled to pharmacy as requested.   

## 2016-05-03 DIAGNOSIS — S129XXS Fracture of neck, unspecified, sequela: Secondary | ICD-10-CM | POA: Diagnosis not present

## 2016-05-03 DIAGNOSIS — M4802 Spinal stenosis, cervical region: Secondary | ICD-10-CM | POA: Diagnosis not present

## 2016-05-10 ENCOUNTER — Other Ambulatory Visit: Payer: Self-pay | Admitting: Family Medicine

## 2016-05-11 NOTE — Telephone Encounter (Signed)
Medication filled to pharmacy as requested.   

## 2016-05-12 DIAGNOSIS — M960 Pseudarthrosis after fusion or arthrodesis: Secondary | ICD-10-CM | POA: Diagnosis not present

## 2016-05-20 DIAGNOSIS — M792 Neuralgia and neuritis, unspecified: Secondary | ICD-10-CM | POA: Diagnosis not present

## 2016-05-20 DIAGNOSIS — M79641 Pain in right hand: Secondary | ICD-10-CM | POA: Diagnosis not present

## 2016-05-20 DIAGNOSIS — M1009 Idiopathic gout, multiple sites: Secondary | ICD-10-CM | POA: Diagnosis not present

## 2016-05-20 DIAGNOSIS — M150 Primary generalized (osteo)arthritis: Secondary | ICD-10-CM | POA: Diagnosis not present

## 2016-05-20 DIAGNOSIS — Z79899 Other long term (current) drug therapy: Secondary | ICD-10-CM | POA: Diagnosis not present

## 2016-05-20 DIAGNOSIS — M4802 Spinal stenosis, cervical region: Secondary | ICD-10-CM | POA: Diagnosis not present

## 2016-05-27 DIAGNOSIS — M47817 Spondylosis without myelopathy or radiculopathy, lumbosacral region: Secondary | ICD-10-CM | POA: Diagnosis not present

## 2016-05-27 DIAGNOSIS — M545 Low back pain: Secondary | ICD-10-CM | POA: Diagnosis not present

## 2016-06-10 ENCOUNTER — Ambulatory Visit: Payer: Medicare HMO | Admitting: Family Medicine

## 2016-06-17 DIAGNOSIS — M545 Low back pain: Secondary | ICD-10-CM | POA: Diagnosis not present

## 2016-06-17 DIAGNOSIS — M47817 Spondylosis without myelopathy or radiculopathy, lumbosacral region: Secondary | ICD-10-CM | POA: Diagnosis not present

## 2016-06-23 ENCOUNTER — Encounter: Payer: Self-pay | Admitting: Family Medicine

## 2016-06-23 ENCOUNTER — Ambulatory Visit (INDEPENDENT_AMBULATORY_CARE_PROVIDER_SITE_OTHER): Payer: Medicare HMO | Admitting: Family Medicine

## 2016-06-23 VITALS — BP 122/80 | HR 70 | Temp 97.9°F | Resp 16 | Ht 70.0 in | Wt 280.2 lb

## 2016-06-23 DIAGNOSIS — E1169 Type 2 diabetes mellitus with other specified complication: Secondary | ICD-10-CM

## 2016-06-23 DIAGNOSIS — E1140 Type 2 diabetes mellitus with diabetic neuropathy, unspecified: Secondary | ICD-10-CM | POA: Diagnosis not present

## 2016-06-23 DIAGNOSIS — I10 Essential (primary) hypertension: Secondary | ICD-10-CM | POA: Diagnosis not present

## 2016-06-23 DIAGNOSIS — E785 Hyperlipidemia, unspecified: Secondary | ICD-10-CM | POA: Diagnosis not present

## 2016-06-23 LAB — LIPID PANEL
CHOL/HDL RATIO: 3
Cholesterol: 139 mg/dL (ref 0–200)
HDL: 45.3 mg/dL (ref >39.00)
LDL Cholesterol: 70 mg/dL (ref 0–99)
NONHDL: 93.34
Triglycerides: 118 mg/dL (ref 0.0–149.0)
VLDL: 23.6 mg/dL (ref 0.0–40.0)

## 2016-06-23 LAB — CBC WITH DIFFERENTIAL/PLATELET
BASOS PCT: 0.6 % (ref 0.0–3.0)
Basophils Absolute: 0 10*3/uL (ref 0.0–0.1)
Eosinophils Absolute: 0.2 10*3/uL (ref 0.0–0.7)
Eosinophils Relative: 4 % (ref 0.0–5.0)
HEMATOCRIT: 43.7 % (ref 39.0–52.0)
HEMOGLOBIN: 14.8 g/dL (ref 13.0–17.0)
LYMPHS PCT: 20.2 % (ref 12.0–46.0)
Lymphs Abs: 1.2 10*3/uL (ref 0.7–4.0)
MCHC: 33.8 g/dL (ref 30.0–36.0)
MCV: 102.6 fl — AB (ref 78.0–100.0)
MONOS PCT: 9.4 % (ref 3.0–12.0)
Monocytes Absolute: 0.6 10*3/uL (ref 0.1–1.0)
NEUTROS ABS: 4.1 10*3/uL (ref 1.4–7.7)
Neutrophils Relative %: 65.8 % (ref 43.0–77.0)
PLATELETS: 243 10*3/uL (ref 150.0–400.0)
RBC: 4.26 Mil/uL (ref 4.22–5.81)
RDW: 14.9 % (ref 11.5–15.5)
WBC: 6.2 10*3/uL (ref 4.0–10.5)

## 2016-06-23 LAB — TSH: TSH: 1.12 u[IU]/mL (ref 0.35–4.50)

## 2016-06-23 LAB — HEPATIC FUNCTION PANEL
ALK PHOS: 52 U/L (ref 39–117)
ALT: 34 U/L (ref 0–53)
AST: 28 U/L (ref 0–37)
Albumin: 4.5 g/dL (ref 3.5–5.2)
BILIRUBIN DIRECT: 0.2 mg/dL (ref 0.0–0.3)
BILIRUBIN TOTAL: 0.8 mg/dL (ref 0.2–1.2)
TOTAL PROTEIN: 7 g/dL (ref 6.0–8.3)

## 2016-06-23 LAB — BASIC METABOLIC PANEL
BUN: 18 mg/dL (ref 6–23)
CALCIUM: 9.8 mg/dL (ref 8.4–10.5)
CO2: 30 meq/L (ref 19–32)
CREATININE: 0.79 mg/dL (ref 0.40–1.50)
Chloride: 100 mEq/L (ref 96–112)
GFR: 103.12 mL/min (ref >60.00)
Glucose, Bld: 154 mg/dL — ABNORMAL HIGH (ref 70–99)
Potassium: 4.3 mEq/L (ref 3.5–5.1)
SODIUM: 140 meq/L (ref 135–145)

## 2016-06-23 LAB — HEMOGLOBIN A1C: HEMOGLOBIN A1C: 7.1 % — AB (ref 4.6–6.5)

## 2016-06-23 NOTE — Progress Notes (Signed)
Pre visit review using our clinic review tool, if applicable. No additional management support is needed unless otherwise documented below in the visit note. 

## 2016-06-23 NOTE — Assessment & Plan Note (Signed)
Chronic problem.  Well controlled.  Asymptomatic.  Check labs.  No anticipated med changes. 

## 2016-06-23 NOTE — Progress Notes (Signed)
   Subjective:    Patient ID: John Bonilla, male    DOB: 10/24/1946, 70 y.o.   MRN: [id number]  HPI DM- chronic problem, on Metformin, Amaryl.  Pt has lost 6 lbs!  Eating better.  On ACE for renal protection.  Rare symptomatic lows.  Not checking CBGs.  Pt due for eye exam.  No numbness/tingling of hands/feet.  No sores on feet.  HTN- chronic problem, on Quinapril, HCTZ daily.  No CP, SOB, HAs, visual changes, edema.  Hyperlipidemia- chronic problem, on Pravastatin daily.  Denies abd pain, N/V.   Review of Systems For ROS see HPI     Objective:   Physical Exam  Constitutional: He is oriented to person, place, and time. He appears well-developed and well-nourished. No distress.  HENT:  Head: Normocephalic and atraumatic.  Eyes: Conjunctivae and EOM are normal. Pupils are equal, round, and reactive to light.  Neck: Normal range of motion. Neck supple. No thyromegaly present.  Cardiovascular: Normal rate, regular rhythm, normal heart sounds and intact distal pulses.   No murmur heard. Pulmonary/Chest: Effort normal and breath sounds normal. No respiratory distress.  Abdominal: Soft. Bowel sounds are normal. He exhibits no distension.  Musculoskeletal: He exhibits no edema.  Lymphadenopathy:    He has no cervical adenopathy.  Neurological: He is alert and oriented to person, place, and time. No cranial nerve deficit.  Skin: Skin is warm and dry.  Psychiatric: He has a normal mood and affect. His behavior is normal.  Vitals reviewed.         Assessment & Plan:

## 2016-06-23 NOTE — Assessment & Plan Note (Signed)
Chronic problem.  Pt is not checking sugars.  Denies symptomatic lows.  UTD on eye exam, foot exam done today.  On ACE for renal protection.  Check labs.  Adjust meds prn

## 2016-06-23 NOTE — Patient Instructions (Signed)
Follow up in 3-4 months to recheck diabetes We'll notify you of your lab results and make any changes if needed Continue to work on healthy diet and regular exercise- you can do it! Call and schedule your eye exam and have them fax me a copy of the report Call with any questions or concerns Have a great summer!!!

## 2016-06-23 NOTE — Assessment & Plan Note (Signed)
Chronic problem.  Tolerating statin w/o difficulty.  Applauded his recent weight loss efforts.  Check labs.  Adjust meds prn

## 2016-07-06 ENCOUNTER — Other Ambulatory Visit: Payer: Self-pay | Admitting: Family Medicine

## 2016-07-06 NOTE — Telephone Encounter (Signed)
Medication filled to pharmacy as requested.   

## 2016-07-09 ENCOUNTER — Other Ambulatory Visit: Payer: Self-pay | Admitting: Family Medicine

## 2016-07-09 NOTE — Telephone Encounter (Signed)
Patient calling to requests refills of the following meds:  meloxicam (MOBIC) 15 MG tablet hydrochlorothiazide (HYDRODIURIL) 12.5 MG tablet glimepiride (AMARYL) 1 MG tablet quinapril (ACCUPRIL) 40 MG tablet  Pharmacy:  [facility] Fax # [phone]

## 2016-07-12 MED ORDER — GLIMEPIRIDE 1 MG PO TABS: ORAL_TABLET | ORAL | 1 refills | Status: DC

## 2016-07-12 MED ORDER — HYDROCHLOROTHIAZIDE 12.5 MG PO TABS: ORAL_TABLET | ORAL | 1 refills | Status: DC

## 2016-07-12 MED ORDER — QUINAPRIL HCL 40 MG PO TABS: 40.0000 mg | ORAL_TABLET | Freq: Every day | ORAL | 1 refills | Status: DC

## 2016-07-12 NOTE — Telephone Encounter (Signed)
Ok for Mobic, #30, 1 refill

## 2016-07-12 NOTE — Telephone Encounter (Signed)
Sent all Rx's into [facility] pharmacy except Mobic.  I don't see where Dr. Birdie Riddle has ever filled Rx.  Can you please advise?

## 2016-07-13 MED ORDER — MELOXICAM 15 MG PO TABS: 15.0000 mg | ORAL_TABLET | Freq: Every day | ORAL | 1 refills | Status: DC

## 2016-07-13 NOTE — Telephone Encounter (Signed)
Rx sent to pharmacy   

## 2016-08-02 DIAGNOSIS — C679 Malignant neoplasm of bladder, unspecified: Secondary | ICD-10-CM | POA: Diagnosis not present

## 2016-08-09 DIAGNOSIS — X32XXXD Exposure to sunlight, subsequent encounter: Secondary | ICD-10-CM | POA: Diagnosis not present

## 2016-08-09 DIAGNOSIS — L570 Actinic keratosis: Secondary | ICD-10-CM | POA: Diagnosis not present

## 2016-08-09 DIAGNOSIS — L820 Inflamed seborrheic keratosis: Secondary | ICD-10-CM | POA: Diagnosis not present

## 2016-09-13 ENCOUNTER — Other Ambulatory Visit: Payer: Self-pay | Admitting: Family Medicine

## 2016-10-07 ENCOUNTER — Encounter: Payer: Self-pay | Admitting: Family Medicine

## 2016-10-07 ENCOUNTER — Ambulatory Visit (INDEPENDENT_AMBULATORY_CARE_PROVIDER_SITE_OTHER): Payer: Commercial Managed Care - HMO | Admitting: Family Medicine

## 2016-10-07 VITALS — BP 143/88 | HR 67 | Temp 98.1°F | Resp 17 | Ht 70.0 in | Wt 285.1 lb

## 2016-10-07 DIAGNOSIS — E1140 Type 2 diabetes mellitus with diabetic neuropathy, unspecified: Secondary | ICD-10-CM | POA: Diagnosis not present

## 2016-10-07 DIAGNOSIS — Z23 Encounter for immunization: Secondary | ICD-10-CM | POA: Diagnosis not present

## 2016-10-07 DIAGNOSIS — F411 Generalized anxiety disorder: Secondary | ICD-10-CM | POA: Insufficient documentation

## 2016-10-07 DIAGNOSIS — R42 Dizziness and giddiness: Secondary | ICD-10-CM

## 2016-10-07 LAB — BASIC METABOLIC PANEL
BUN: 18 mg/dL (ref 6–23)
CHLORIDE: 101 meq/L (ref 96–112)
CO2: 31 mEq/L (ref 19–32)
CREATININE: 0.79 mg/dL (ref 0.40–1.50)
Calcium: 9.7 mg/dL (ref 8.4–10.5)
GFR: 103.04 mL/min (ref >60.00)
Glucose, Bld: 141 mg/dL — ABNORMAL HIGH (ref 70–99)
Potassium: 4.2 mEq/L (ref 3.5–5.1)
Sodium: 143 mEq/L (ref 135–145)

## 2016-10-07 LAB — CBC WITH DIFFERENTIAL/PLATELET
BASOS ABS: 0.1 10*3/uL (ref 0.0–0.1)
Basophils Relative: 0.8 % (ref 0.0–3.0)
EOS ABS: 0.3 10*3/uL (ref 0.0–0.7)
Eosinophils Relative: 4.5 % (ref 0.0–5.0)
HCT: 43.5 % (ref 39.0–52.0)
Hemoglobin: 14.6 g/dL (ref 13.0–17.0)
LYMPHS ABS: 1.4 10*3/uL (ref 0.7–4.0)
Lymphocytes Relative: 19.7 % (ref 12.0–46.0)
MCHC: 33.7 g/dL (ref 30.0–36.0)
MCV: 104 fl — AB (ref 78.0–100.0)
MONO ABS: 0.6 10*3/uL (ref 0.1–1.0)
Monocytes Relative: 9 % (ref 3.0–12.0)
NEUTROS ABS: 4.7 10*3/uL (ref 1.4–7.7)
NEUTROS PCT: 66 % (ref 43.0–77.0)
PLATELETS: 248 10*3/uL (ref 150.0–400.0)
RBC: 4.18 Mil/uL — ABNORMAL LOW (ref 4.22–5.81)
RDW: 15.2 % (ref 11.5–15.5)
WBC: 7.1 10*3/uL (ref 4.0–10.5)

## 2016-10-07 LAB — HEMOGLOBIN A1C: HEMOGLOBIN A1C: 6.8 % — AB (ref 4.6–6.5)

## 2016-10-07 NOTE — Progress Notes (Signed)
   Subjective:    Patient ID: John Bonilla, male    DOB: [birth year], 70 y.o.   MRN: [id number]  HPI DM- chronic problem, on Metformin 4x/day, Amaryl daily.  On ACE for renal protection.  Due for foot (pt reports this was done at [facility]) and eye exams John Bonilla).  Last A1C 7.1  Pt has gained 5 lbs since last visit.  Pt reports being 'more prone to be lightheaded'.  Is 'backing off' activities due to dizziness.  Pt reports sxs x6 months but worsening recently.  No change in water intake.  No CP, SOB, HAs, visual changes, abd pain, N/V.  + chronic numbness of feet.  Rare symptomatic lows.   Review of Systems For ROS see HPI     Objective:   Physical Exam  Constitutional: He is oriented to person, place, and time. He appears well-developed and well-nourished. No distress.  obese  HENT:  Head: Normocephalic and atraumatic.  Eyes: Conjunctivae and EOM are normal. Pupils are equal, round, and reactive to light.  Neck: Normal range of motion. Neck supple. No thyromegaly present.  Cardiovascular: Normal rate, regular rhythm, normal heart sounds and intact distal pulses.   No murmur heard. Pulmonary/Chest: Effort normal and breath sounds normal. No respiratory distress.  Abdominal: Soft. Bowel sounds are normal. He exhibits no distension.  Musculoskeletal: He exhibits no edema.  Lymphadenopathy:    He has no cervical adenopathy.  Neurological: He is alert and oriented to person, place, and time. No cranial nerve deficit.  Skin: Skin is warm and dry.  Psychiatric: He has a normal mood and affect. His behavior is normal.  Vitals reviewed.         Assessment & Plan:

## 2016-10-07 NOTE — Progress Notes (Signed)
Pre visit review using our clinic review tool, if applicable. No additional management support is needed unless otherwise documented below in the visit note. 

## 2016-10-07 NOTE — Patient Instructions (Signed)
Follow up in 6 weeks to check anxiety We'll notify you of your lab results and make any changes if needed We'll call you with your eye exam Try and find a stress outlet- you deserve it! Increase your water intake- this will help w/ the dizziness Call with any questions or concerns Hang in there!  We'll figure this out!!!

## 2016-10-07 NOTE — Assessment & Plan Note (Signed)
Chronic problem.  Foot exam done at [facility].  Due for eye exam- referral provided.  On ACE for renal protection.  Denies symptomatic lows.  Pt is not exercising regularly- stressed need for healthy diet and regular activity.  Check labs.  Adjust meds prn

## 2016-10-07 NOTE — Assessment & Plan Note (Signed)
New.  Pt is having difficulty sleeping, increased irritability, and admits to higher stress levels.  This may be contributing to some of his dizziness.  If labs are normal, will plan on starting low dose Zoloft.  Pt expressed understanding and is in agreement w/ plan.

## 2016-10-07 NOTE — Assessment & Plan Note (Signed)
New.  Unclear if this is due to neuropathy, age related difficulty w/ proprioception, low sugar, increased anxiety vs other cause.  If labs are normal, will refer to neuro for complete workup.  Pt expressed understanding and is in agreement w/ plan.

## 2016-10-08 ENCOUNTER — Encounter: Payer: Self-pay | Admitting: Family Medicine

## 2016-10-11 LAB — TSH: TSH: 1.88 u[IU]/mL (ref 0.35–4.50)

## 2016-10-22 ENCOUNTER — Encounter (HOSPITAL_COMMUNITY): Payer: Self-pay

## 2016-10-25 DIAGNOSIS — S129XXS Fracture of neck, unspecified, sequela: Secondary | ICD-10-CM | POA: Diagnosis not present

## 2016-10-25 DIAGNOSIS — M960 Pseudarthrosis after fusion or arthrodesis: Secondary | ICD-10-CM | POA: Diagnosis not present

## 2016-10-25 DIAGNOSIS — M4802 Spinal stenosis, cervical region: Secondary | ICD-10-CM | POA: Diagnosis not present

## 2016-11-03 DIAGNOSIS — R300 Dysuria: Secondary | ICD-10-CM | POA: Diagnosis not present

## 2016-11-03 DIAGNOSIS — C679 Malignant neoplasm of bladder, unspecified: Secondary | ICD-10-CM | POA: Diagnosis not present

## 2016-11-08 ENCOUNTER — Encounter: Payer: Self-pay | Admitting: Family Medicine

## 2016-11-08 ENCOUNTER — Ambulatory Visit (INDEPENDENT_AMBULATORY_CARE_PROVIDER_SITE_OTHER): Payer: Commercial Managed Care - HMO | Admitting: Family Medicine

## 2016-11-08 VITALS — BP 138/86 | HR 96 | Temp 98.2°F | Resp 17 | Ht 70.0 in | Wt 285.0 lb

## 2016-11-08 DIAGNOSIS — J0100 Acute maxillary sinusitis, unspecified: Secondary | ICD-10-CM | POA: Diagnosis not present

## 2016-11-08 MED ORDER — AMOXICILLIN 875 MG PO TABS: 875.0000 mg | ORAL_TABLET | Freq: Two times a day (BID) | ORAL | 0 refills | Status: DC

## 2016-11-08 NOTE — Progress Notes (Signed)
   Subjective:    Patient ID: John Bonilla, male    DOB: 05-07-1946, 70 y.o.   MRN: [id number]  HPI Cough- pt reports 'a cold for 2 weeks at least' and 'it's been progressively getting worse'.  + coughing, sneezing.  Pt noted a 'rasp' in his breathing last night.  + hx of pneumonia.  Tm 99.8.  + sick contacts.  + sinus pressure.  No tooth pain, ear pain, sore throat.  + PND.  Cough is intermittently productive.   Review of Systems For ROS see HPI     Objective:   Physical Exam  Constitutional: He appears well-developed and well-nourished. No distress.  HENT:  Head: Normocephalic and atraumatic.  Right Ear: Tympanic membrane normal.  Left Ear: Tympanic membrane normal.  Nose: Mucosal edema and rhinorrhea present. Right sinus exhibits maxillary sinus tenderness and frontal sinus tenderness. Left sinus exhibits maxillary sinus tenderness and frontal sinus tenderness.  Mouth/Throat: Mucous membranes are normal. Oropharyngeal exudate and posterior oropharyngeal erythema present. No posterior oropharyngeal edema.  + PND  Eyes: Conjunctivae and EOM are normal. Pupils are equal, round, and reactive to light.  Neck: Normal range of motion. Neck supple.  Cardiovascular: Normal rate, regular rhythm and normal heart sounds.   Pulmonary/Chest: Effort normal and breath sounds normal. No respiratory distress. He has no wheezes.  + hacking cough  Lymphadenopathy:    He has no cervical adenopathy.  Skin: Skin is warm and dry.  Vitals reviewed.         Assessment & Plan:  Maxillary and frontal sinusitis- new.  Pt's sxs and PE consistent w/ infxn.  Start abx.  Reviewed supportive care and red flags that should prompt return.  Pt expressed understanding and is in agreement w/ plan.

## 2016-11-08 NOTE — Progress Notes (Signed)
Pre visit review using our clinic review tool, if applicable. No additional management support is needed unless otherwise documented below in the visit note. 

## 2016-11-08 NOTE — Patient Instructions (Signed)
Follow up as needed Start the Amoxicillin twice daily- take w/ food Drink plenty of fluids Mucinex DM or Delsym as needed for cough REST! Call with any questions or concerns Hang in there!!!

## 2016-11-12 ENCOUNTER — Other Ambulatory Visit: Payer: Self-pay | Admitting: Family Medicine

## 2016-11-15 ENCOUNTER — Ambulatory Visit: Payer: Commercial Managed Care - HMO | Admitting: Family Medicine

## 2016-11-17 ENCOUNTER — Encounter: Payer: Self-pay | Admitting: Family Medicine

## 2016-11-17 ENCOUNTER — Ambulatory Visit (INDEPENDENT_AMBULATORY_CARE_PROVIDER_SITE_OTHER): Payer: Commercial Managed Care - HMO | Admitting: Family Medicine

## 2016-11-17 VITALS — BP 122/84 | HR 77 | Temp 98.1°F | Ht 70.0 in | Wt 287.1 lb

## 2016-11-17 DIAGNOSIS — F411 Generalized anxiety disorder: Secondary | ICD-10-CM | POA: Diagnosis not present

## 2016-11-17 LAB — HM DIABETES EYE EXAM

## 2016-11-17 MED ORDER — GLIMEPIRIDE 1 MG PO TABS: ORAL_TABLET | ORAL | 1 refills | Status: DC

## 2016-11-17 MED ORDER — METFORMIN HCL 500 MG PO TABS: ORAL_TABLET | ORAL | 1 refills | Status: DC

## 2016-11-17 MED ORDER — HYDROCHLOROTHIAZIDE 12.5 MG PO TABS: ORAL_TABLET | ORAL | 1 refills | Status: DC

## 2016-11-17 NOTE — Progress Notes (Signed)
   Subjective:    Patient ID: John Bonilla, male    DOB: 1946-11-19, 70 y.o.   MRN: [id number]  HPI Anxiety- pt was previously having difficulty sleeping, increased irritability, and high stress levels.  Pt reports that those situations have not changed but he overall feels good.  Pt reports he is doing better w/ managing his emotions and once he has an emotional response he is able to move on and put it behind him.  Pt has a very healthy outlook on his past experiences ([place], etc).   Review of Systems For ROS see HPI     Objective:   Physical Exam  Constitutional: He is oriented to person, place, and time. He appears well-developed and well-nourished. No distress.  obesity  Neurological: He is alert and oriented to person, place, and time.  Skin: Skin is warm and dry.  Psychiatric: He has a normal mood and affect. His behavior is normal. Thought content normal.  Vitals reviewed.         Assessment & Plan:

## 2016-11-17 NOTE — Progress Notes (Signed)
Pre visit review using our clinic review tool, if applicable. No additional management support is needed unless otherwise documented below in the visit note. 

## 2016-11-17 NOTE — Assessment & Plan Note (Signed)
Improving.  Pt has a healthy outlook emotionally and is taking steps to improve the way he manages his stress.  Will continue to follow at future visits.  Pt expressed understanding and is in agreement w/ plan.

## 2016-11-17 NOTE — Patient Instructions (Signed)
Schedule a diabetes and cholesterol followup for late January/early February Keep up the good work- you look great! I refilled the HCTZ, Metformin, and Glimepiride today- if you need anything else, let me know!! The Metformin is 2 tabs twice daily- w/ food Call with any questions or concerns Hang in there!!!

## 2016-11-18 DIAGNOSIS — M150 Primary generalized (osteo)arthritis: Secondary | ICD-10-CM | POA: Diagnosis not present

## 2016-11-18 DIAGNOSIS — Z6841 Body Mass Index (BMI) 40.0 and over, adult: Secondary | ICD-10-CM | POA: Diagnosis not present

## 2016-11-18 DIAGNOSIS — M1009 Idiopathic gout, multiple sites: Secondary | ICD-10-CM | POA: Diagnosis not present

## 2016-11-18 DIAGNOSIS — Z79899 Other long term (current) drug therapy: Secondary | ICD-10-CM | POA: Diagnosis not present

## 2016-11-18 DIAGNOSIS — M4802 Spinal stenosis, cervical region: Secondary | ICD-10-CM | POA: Diagnosis not present

## 2016-12-27 ENCOUNTER — Telehealth: Payer: Self-pay | Admitting: Family Medicine

## 2016-12-27 NOTE — Telephone Encounter (Signed)
Pt states that Rx for meloxicam & allopurinol were not called in for refills. pt asking if a 2 wk supply could be called into [facility] on [address] and then all other refills be called into [facility] mail order. Pt asking if he is to stop these let him know.

## 2016-12-28 MED ORDER — MELOXICAM 15 MG PO TABS: 15.0000 mg | ORAL_TABLET | Freq: Every day | ORAL | 0 refills | Status: DC

## 2016-12-28 MED ORDER — MELOXICAM 15 MG PO TABS: 15.0000 mg | ORAL_TABLET | Freq: Every day | ORAL | 1 refills | Status: DC

## 2016-12-28 MED ORDER — ALLOPURINOL 300 MG PO TABS: 300.0000 mg | ORAL_TABLET | Freq: Every day | ORAL | 0 refills | Status: DC

## 2016-12-28 MED ORDER — ALLOPURINOL 100 MG PO TABS
200.0000 mg | ORAL_TABLET | Freq: Every day | ORAL | 1 refills | Status: DC
Start: 2016-12-28 — End: 2017-05-12

## 2016-12-28 MED ORDER — ALLOPURINOL 300 MG PO TABS: 300.0000 mg | ORAL_TABLET | Freq: Every day | ORAL | 1 refills | Status: DC

## 2016-12-28 NOTE — Telephone Encounter (Signed)
Left message for patient to call back so that I can confirm pharmacy before sending RX's in.

## 2016-12-28 NOTE — Telephone Encounter (Signed)
Per patient: Allopurinol 300mg  is what needs to be sent to the local pharmacy, and both the 100  And 300 called in to the mail order. Meloxicam also needed to both

## 2016-12-28 NOTE — Addendum Note (Signed)
Addended by: Katina Dung on: 12/28/2016 12:02 PM   Modules accepted: Orders

## 2016-12-28 NOTE — Telephone Encounter (Signed)
Ok for 1 month refill to local pharmacy and then 3 months to mail order

## 2017-01-17 ENCOUNTER — Ambulatory Visit: Payer: Commercial Managed Care - HMO | Admitting: Family Medicine

## 2017-02-03 ENCOUNTER — Telehealth: Payer: Self-pay | Admitting: Family Medicine

## 2017-02-03 NOTE — Telephone Encounter (Signed)
Called patient to schedule awv. Patient's voicemail has not been set up. Will try to call patient again at a later date.

## 2017-02-07 DIAGNOSIS — N401 Enlarged prostate with lower urinary tract symptoms: Secondary | ICD-10-CM | POA: Diagnosis not present

## 2017-02-07 DIAGNOSIS — N138 Other obstructive and reflux uropathy: Secondary | ICD-10-CM | POA: Diagnosis not present

## 2017-02-07 DIAGNOSIS — C679 Malignant neoplasm of bladder, unspecified: Secondary | ICD-10-CM | POA: Diagnosis not present

## 2017-02-11 ENCOUNTER — Other Ambulatory Visit: Payer: Self-pay | Admitting: Family Medicine

## 2017-02-22 DIAGNOSIS — N359 Urethral stricture, unspecified: Secondary | ICD-10-CM | POA: Diagnosis not present

## 2017-02-22 DIAGNOSIS — C679 Malignant neoplasm of bladder, unspecified: Secondary | ICD-10-CM | POA: Diagnosis not present

## 2017-05-10 ENCOUNTER — Other Ambulatory Visit: Payer: Self-pay | Admitting: General Practice

## 2017-05-10 ENCOUNTER — Telehealth: Payer: Self-pay | Admitting: Family Medicine

## 2017-05-10 MED ORDER — QUINAPRIL HCL 40 MG PO TABS: 40.0000 mg | ORAL_TABLET | Freq: Every day | ORAL | 1 refills | Status: DC

## 2017-05-10 MED ORDER — QUINAPRIL HCL 40 MG PO TABS: 40.0000 mg | ORAL_TABLET | Freq: Every day | ORAL | 0 refills | Status: DC

## 2017-05-10 NOTE — Telephone Encounter (Signed)
Pt states that [facility] mail order pharmacy has sent request for refill for quinapril, I let pt know that we have not receive any refill request at this time. Pt asked if we could send in a 2 [facility] supply to [facility] and refills to [facility] order.

## 2017-05-10 NOTE — Telephone Encounter (Signed)
Medication filled to both pharmacies as requested.

## 2017-05-12 ENCOUNTER — Other Ambulatory Visit: Payer: Self-pay | Admitting: General Practice

## 2017-05-12 MED ORDER — ALLOPURINOL 100 MG PO TABS: 200.0000 mg | ORAL_TABLET | Freq: Every day | ORAL | 1 refills | Status: DC

## 2017-05-12 MED ORDER — ALLOPURINOL 300 MG PO TABS: 300.0000 mg | ORAL_TABLET | Freq: Every day | ORAL | 1 refills | Status: DC

## 2017-05-12 MED ORDER — GLUCOSE BLOOD VI STRP: ORAL_STRIP | 1 refills | Status: DC

## 2017-05-12 MED ORDER — ACCU-CHEK SMARTVIEW CONTROL VI LIQD: 1 refills | Status: DC

## 2017-05-12 MED ORDER — QUINAPRIL HCL 40 MG PO TABS: 40.0000 mg | ORAL_TABLET | Freq: Every day | ORAL | 1 refills | Status: DC

## 2017-05-12 MED ORDER — ACCU-CHEK NANO SMARTVIEW W/DEVICE KIT: PACK | 0 refills | Status: DC

## 2017-05-12 MED ORDER — ACCU-CHEK FASTCLIX LANCETS MISC: 1 refills | Status: DC

## 2017-05-12 MED ORDER — HYDROCHLOROTHIAZIDE 12.5 MG PO TABS: ORAL_TABLET | ORAL | 1 refills | Status: DC

## 2017-05-18 ENCOUNTER — Ambulatory Visit (INDEPENDENT_AMBULATORY_CARE_PROVIDER_SITE_OTHER): Payer: Medicare HMO | Admitting: Family Medicine

## 2017-05-18 ENCOUNTER — Encounter: Payer: Self-pay | Admitting: Family Medicine

## 2017-05-18 VITALS — BP 120/81 | HR 69 | Temp 98.1°F | Resp 16 | Ht 70.0 in | Wt 285.1 lb

## 2017-05-18 DIAGNOSIS — I10 Essential (primary) hypertension: Secondary | ICD-10-CM

## 2017-05-18 DIAGNOSIS — M1A9XX0 Chronic gout, unspecified, without tophus (tophi): Secondary | ICD-10-CM

## 2017-05-18 DIAGNOSIS — E785 Hyperlipidemia, unspecified: Secondary | ICD-10-CM | POA: Diagnosis not present

## 2017-05-18 DIAGNOSIS — E1140 Type 2 diabetes mellitus with diabetic neuropathy, unspecified: Secondary | ICD-10-CM | POA: Diagnosis not present

## 2017-05-18 DIAGNOSIS — E1169 Type 2 diabetes mellitus with other specified complication: Secondary | ICD-10-CM

## 2017-05-18 DIAGNOSIS — M109 Gout, unspecified: Secondary | ICD-10-CM | POA: Insufficient documentation

## 2017-05-18 LAB — HEPATIC FUNCTION PANEL
ALBUMIN: 4.4 g/dL (ref 3.5–5.2)
ALK PHOS: 55 U/L (ref 39–117)
ALT: 23 U/L (ref 0–53)
AST: 21 U/L (ref 0–37)
Bilirubin, Direct: 0.1 mg/dL (ref 0.0–0.3)
TOTAL PROTEIN: 6.9 g/dL (ref 6.0–8.3)
Total Bilirubin: 0.6 mg/dL (ref 0.2–1.2)

## 2017-05-18 LAB — CBC WITH DIFFERENTIAL/PLATELET
BASOS ABS: 0.1 10*3/uL (ref 0.0–0.1)
Basophils Relative: 1.1 % (ref 0.0–3.0)
Eosinophils Absolute: 0.2 10*3/uL (ref 0.0–0.7)
Eosinophils Relative: 3.7 % (ref 0.0–5.0)
HCT: 42.9 % (ref 39.0–52.0)
HEMOGLOBIN: 14.7 g/dL (ref 13.0–17.0)
LYMPHS ABS: 1.3 10*3/uL (ref 0.7–4.0)
Lymphocytes Relative: 20.4 % (ref 12.0–46.0)
MCHC: 34.4 g/dL (ref 30.0–36.0)
MCV: 102.4 fl — ABNORMAL HIGH (ref 78.0–100.0)
MONO ABS: 0.6 10*3/uL (ref 0.1–1.0)
Monocytes Relative: 9.6 % (ref 3.0–12.0)
NEUTROS PCT: 65.2 % (ref 43.0–77.0)
Neutro Abs: 4.1 10*3/uL (ref 1.4–7.7)
Platelets: 241 10*3/uL (ref 150.0–400.0)
RBC: 4.19 Mil/uL — AB (ref 4.22–5.81)
RDW: 14.3 % (ref 11.5–15.5)
WBC: 6.2 10*3/uL (ref 4.0–10.5)

## 2017-05-18 LAB — LIPID PANEL
CHOLESTEROL: 142 mg/dL (ref 0–200)
HDL: 52.9 mg/dL (ref >39.00)
LDL Cholesterol: 70 mg/dL (ref 0–99)
NonHDL: 89.21
TRIGLYCERIDES: 96 mg/dL (ref 0.0–149.0)
Total CHOL/HDL Ratio: 3
VLDL: 19.2 mg/dL (ref 0.0–40.0)

## 2017-05-18 LAB — BASIC METABOLIC PANEL
BUN: 15 mg/dL (ref 6–23)
CO2: 31 mEq/L (ref 19–32)
CREATININE: 0.77 mg/dL (ref 0.40–1.50)
Calcium: 9.6 mg/dL (ref 8.4–10.5)
Chloride: 100 mEq/L (ref 96–112)
GFR: 105.94 mL/min (ref >60.00)
GLUCOSE: 179 mg/dL — AB (ref 70–99)
POTASSIUM: 4.2 meq/L (ref 3.5–5.1)
Sodium: 140 mEq/L (ref 135–145)

## 2017-05-18 LAB — HEMOGLOBIN A1C: Hgb A1c MFr Bld: 7.7 % — ABNORMAL HIGH (ref 4.6–6.5)

## 2017-05-18 LAB — TSH: TSH: 1.61 u[IU]/mL (ref 0.35–4.50)

## 2017-05-18 LAB — URIC ACID: Uric Acid, Serum: 4.9 mg/dL (ref 4.0–7.8)

## 2017-05-18 NOTE — Progress Notes (Signed)
Pre visit review using our clinic review tool, if applicable. No additional management support is needed unless otherwise documented below in the visit note. 

## 2017-05-18 NOTE — Progress Notes (Addendum)
   Subjective:    Patient ID: John Bonilla, male    DOB: 07/09/1946, 71 y.o.   MRN: [id number]  HPI DM- chronic problem, on Glimepiride 1mg , Metformin 500mg  1 tab and 2 QHS.  On ACE for renal protection.  UTD on eye exam.  Due for foot exam.  Hyperlipidemia- chronic problem, on Pravstatin 40mg  daily.  Denies abd pain, N/V.  HTN- chronic problem, on HCTZ and Quinapril 40mg  daily w/ good control.  Denies CP.  + SOB w/ exertion.  No HAs, visual changes, edema.  Morbid obesity- BMI remains >40.  S/p lap band.  Not exercising regularly.  Not following a particular diet  Gout- chronic problem.  Pt is supposed to be on 500mg  allopurinol daily plus daily colchicine but mail order pharmacy has only sent the 300mg  tabs.    Review of Systems For ROS see HPI     Objective:   Physical Exam: Obese NCAT PERRL, EOMI No thyromegaly RRR, no M/R/G Lungs CTAB Soft, NT/ND PT/DP pulses intact No edema bilaterally AAOx3 Mood and affect normal, thought process linear, memory intact      Assessment & Plan:

## 2017-05-18 NOTE — Patient Instructions (Signed)
Please schedule your complete physical in 3-4 months and you Medicare Wellness Visit at the same time [facility] notify you of your lab results and make any changes if needed Continue to work on healthy diet and regular exercise- you can do it! We'll determine if you need to increase the Allopurinol based on your lab results Call with any questions or concerns Have a great summer!!!

## 2017-05-19 DIAGNOSIS — M1812 Unilateral primary osteoarthritis of first carpometacarpal joint, left hand: Secondary | ICD-10-CM | POA: Diagnosis not present

## 2017-05-19 DIAGNOSIS — G5712 Meralgia paresthetica, left lower limb: Secondary | ICD-10-CM | POA: Diagnosis not present

## 2017-05-19 DIAGNOSIS — R200 Anesthesia of skin: Secondary | ICD-10-CM | POA: Diagnosis not present

## 2017-05-19 DIAGNOSIS — M4802 Spinal stenosis, cervical region: Secondary | ICD-10-CM | POA: Diagnosis not present

## 2017-05-19 DIAGNOSIS — M1009 Idiopathic gout, multiple sites: Secondary | ICD-10-CM | POA: Diagnosis not present

## 2017-05-19 DIAGNOSIS — M150 Primary generalized (osteo)arthritis: Secondary | ICD-10-CM | POA: Diagnosis not present

## 2017-05-19 DIAGNOSIS — M1811 Unilateral primary osteoarthritis of first carpometacarpal joint, right hand: Secondary | ICD-10-CM | POA: Diagnosis not present

## 2017-05-19 DIAGNOSIS — Z79899 Other long term (current) drug therapy: Secondary | ICD-10-CM | POA: Diagnosis not present

## 2017-05-20 NOTE — Assessment & Plan Note (Signed)
Chronic problem.  Tolerating statin w/o difficulty.  Stressed need for healthy diet and regular exercise.  Check labs.  Adjust meds prn  

## 2017-05-20 NOTE — Progress Notes (Signed)
Called pt and lmovm to return call.

## 2017-05-20 NOTE — Assessment & Plan Note (Signed)
Chronic problem.  On HCTZ and Quinapril daily w/ good control.  Asymptomatic w/ exception of SOB which I suspect is due to his morbid obesity and deconditioning.  Check labs.  No anticipated med changes.  Will follow.

## 2017-05-20 NOTE — Assessment & Plan Note (Signed)
Chronic problem.  Pt is currently taking 300mg  Allopurinol daily but is supposed to be taking 500mg  daily.  Will check Uric Acid level and determine if higher dose is needed.  Pt expressed understanding and is in agreement w/ plan.

## 2017-05-20 NOTE — Assessment & Plan Note (Signed)
Chronic problem.  Pt is not exercising nor following a particular diet.  Stressed need for both.  Check labs to risk stratify.  Will follow.

## 2017-05-20 NOTE — Assessment & Plan Note (Signed)
Chronic problem.  On Glimepiride, Metformin w/ hx of adequate control.  On ACE for renal protection.  UTD on eye exam.  Foot exam done today.  Stressed need for healthy diet and regular exercise.  Check labs.  Adjust meds prn

## 2017-05-31 DIAGNOSIS — C679 Malignant neoplasm of bladder, unspecified: Secondary | ICD-10-CM | POA: Diagnosis not present

## 2017-07-14 ENCOUNTER — Other Ambulatory Visit: Payer: Self-pay | Admitting: Family Medicine

## 2017-08-30 ENCOUNTER — Encounter (HOSPITAL_COMMUNITY): Payer: Self-pay

## 2017-09-01 DIAGNOSIS — C679 Malignant neoplasm of bladder, unspecified: Secondary | ICD-10-CM | POA: Diagnosis not present

## 2017-09-02 ENCOUNTER — Encounter: Payer: Self-pay | Admitting: Family Medicine

## 2017-09-02 ENCOUNTER — Ambulatory Visit (INDEPENDENT_AMBULATORY_CARE_PROVIDER_SITE_OTHER): Payer: Medicare HMO | Admitting: Family Medicine

## 2017-09-02 VITALS — BP 134/84 | HR 80 | Temp 98.1°F | Resp 16 | Ht 70.0 in | Wt 278.2 lb

## 2017-09-02 DIAGNOSIS — J301 Allergic rhinitis due to pollen: Secondary | ICD-10-CM | POA: Diagnosis not present

## 2017-09-02 DIAGNOSIS — J0100 Acute maxillary sinusitis, unspecified: Secondary | ICD-10-CM

## 2017-09-02 DIAGNOSIS — R42 Dizziness and giddiness: Secondary | ICD-10-CM

## 2017-09-02 MED ORDER — CETIRIZINE HCL 10 MG PO TABS: 10.0000 mg | ORAL_TABLET | Freq: Every day | ORAL | 11 refills | Status: AC

## 2017-09-02 MED ORDER — AMOXICILLIN 875 MG PO TABS: 875.0000 mg | ORAL_TABLET | Freq: Two times a day (BID) | ORAL | 0 refills | Status: DC

## 2017-09-02 MED ORDER — MECLIZINE HCL 25 MG PO TABS: 25.0000 mg | ORAL_TABLET | Freq: Three times a day (TID) | ORAL | 0 refills | Status: DC | PRN

## 2017-09-02 NOTE — Patient Instructions (Signed)
Follow up as needed or as scheduled Start daily Zyrtec to improve allergy congestion Start the Amoxicillin twice daily for sinus infection- take w/ food Drink plenty of fluids Use the Meclizine as needed for dizziness Change positions slowly to allow for you to steady yourself Call with any questions or concerns- particularly if anything changes or worsens Hang in there!!!

## 2017-09-02 NOTE — Progress Notes (Signed)
   Subjective:    Patient ID: MAHAMUD METTS, male    DOB: 21-Aug-1946, 71 y.o.   MRN: [id number]  HPI Dizziness- 'at worst, very lightheaded and unsteady on my feet'.  More notable in the AM but can occur throughout the day.  Pt reports BP has been steady.  No sugar in urine yesterday at Urology.  sxs started earlier this week.  Will intermittently feel like things are spinning.  No ear pain/pressure.  + HAs- 'low grade for a couple of weeks'.  HAs are frontal.  sxs are worse with turning his head.   Review of Systems For ROS see HPI     Objective:   Physical Exam  Constitutional: He is oriented to person, place, and time. He appears well-developed and well-nourished. No distress.  HENT:  Head: Normocephalic and atraumatic.  Right Ear: Tympanic membrane is retracted.  Left Ear: Tympanic membrane is retracted.  Nose: Mucosal edema and rhinorrhea present. Right sinus exhibits maxillary sinus tenderness. Right sinus exhibits no frontal sinus tenderness. Left sinus exhibits maxillary sinus tenderness. Left sinus exhibits no frontal sinus tenderness.  Mouth/Throat: Mucous membranes are normal. No oropharyngeal exudate, posterior oropharyngeal edema or posterior oropharyngeal erythema.  + PND  Eyes: Pupils are equal, round, and reactive to light. Conjunctivae and EOM are normal.  2-3 beats of horizontal nystagmus  Neck: Normal range of motion. Neck supple.  Cardiovascular: Normal rate, regular rhythm and normal heart sounds.   Pulmonary/Chest: Effort normal and breath sounds normal. No respiratory distress. He has no wheezes.  Lymphadenopathy:    He has no cervical adenopathy.  Neurological: He is alert and oriented to person, place, and time.  Skin: Skin is warm and dry.  Psychiatric: He has a normal mood and affect. His behavior is normal. Thought content normal.  Vitals reviewed.         Assessment & Plan:  Vertigo- new.  Suspect this is due to pt's maxillary sinusitis.  Start  Meclizine prn.  Reviewed dx and tx plan.  Reviewed supportive care and red flags that should prompt return.  Pt expressed understanding and is in agreement w/ plan.   Sinusitis- new.  Pt's sxs and PE are consistent w/ infxn.  Start abx.  Reviewed supportive care and red flags that should prompt return.  Pt expressed understanding and is in agreement w/ plan.   Allergic rhinitis- new.  Suspect this is what caused both the sinus infxn and vertigo.  Start Zyrtec daily.  Reviewed supportive care and red flags that should prompt return.  Pt expressed understanding and is in agreement w/ plan.

## 2017-09-09 LAB — HEMOGLOBIN A1C: Hemoglobin A1C: 6.7

## 2017-09-17 ENCOUNTER — Other Ambulatory Visit: Payer: Self-pay | Admitting: Family Medicine

## 2017-09-27 ENCOUNTER — Encounter: Payer: Self-pay | Admitting: General Practice

## 2017-11-14 DIAGNOSIS — Z79899 Other long term (current) drug therapy: Secondary | ICD-10-CM | POA: Diagnosis not present

## 2017-11-14 DIAGNOSIS — M1811 Unilateral primary osteoarthritis of first carpometacarpal joint, right hand: Secondary | ICD-10-CM | POA: Diagnosis not present

## 2017-11-14 DIAGNOSIS — M1009 Idiopathic gout, multiple sites: Secondary | ICD-10-CM | POA: Diagnosis not present

## 2017-11-14 DIAGNOSIS — R200 Anesthesia of skin: Secondary | ICD-10-CM | POA: Diagnosis not present

## 2017-11-14 DIAGNOSIS — M4802 Spinal stenosis, cervical region: Secondary | ICD-10-CM | POA: Diagnosis not present

## 2017-11-14 DIAGNOSIS — M1812 Unilateral primary osteoarthritis of first carpometacarpal joint, left hand: Secondary | ICD-10-CM | POA: Diagnosis not present

## 2017-11-14 DIAGNOSIS — Z6839 Body mass index (BMI) 39.0-39.9, adult: Secondary | ICD-10-CM | POA: Diagnosis not present

## 2017-11-14 DIAGNOSIS — M150 Primary generalized (osteo)arthritis: Secondary | ICD-10-CM | POA: Diagnosis not present

## 2017-11-14 DIAGNOSIS — G5712 Meralgia paresthetica, left lower limb: Secondary | ICD-10-CM | POA: Diagnosis not present

## 2017-11-19 ENCOUNTER — Other Ambulatory Visit: Payer: Self-pay | Admitting: Family Medicine

## 2017-11-23 NOTE — Progress Notes (Deleted)
Subjective:   John Bonilla is a 71 y.o. male who presents for Medicare Annual/Subsequent preventive examination.  Review of Systems:  No ROS.  Medicare Wellness Visit. Additional risk factors are reflected in the social history.    Sleep patterns: Home Safety/Smoke Alarms: Feels safe in home. Smoke alarms in place.  Living environment; residence and Firearm Safety:  Cohoes Safety/Bike Helmet: Wears seat belt.    Male:   CCS-Colonoscopy 12/22/2012,  ?? 2008?    PSA- No results found for: PSA      Objective:    Vitals: There were no vitals taken for this visit.  There is no height or weight on file to calculate BMI.  Advanced Directives 11/19/2015  Does Patient Have a Medical Advance Directive? No  Would patient like information on creating a medical advance directive? Yes - Scientist, clinical (histocompatibility and immunogenetics) given    Tobacco Social History   Tobacco Use  Smoking Status Former Smoker  . Last attempt to quit: 12/13/1988  . Years since quitting: 28.9  Smokeless Tobacco Never Used     Counseling given: Not Answered     Past Medical History:  Diagnosis Date  . Arthritis   . Cancer (HCC)    BLADDER+ SKIN   . Diabetes mellitus without complication ([place])   . GERD (gastroesophageal reflux disease)    RESOLVED   . History of hiatal hernia   . History of kidney stones   . Hyperlipidemia   . Hypertension   . Morbid obesity ([place])   . Shortness of breath dyspnea    WITH EXERTION   . Sleep apnea    CPAP    Past Surgical History:  Procedure Laterality Date  . ANTERIOR CERVICAL DECOMP/DISCECTOMY FUSION N/A 11/26/2015   Procedure: C5-6 ANTERIOR CERVICAL DECOMPRESSION/DISCECTOMY FUSION ;  Surgeon: Eustace Moore, MD;  Location: [facility];  Service: Neurosurgery;  Laterality: N/A;  C5-6 ANTERIOR CERVICAL DECOMPRESSION/DISCECTOMY FUSION   . CATARACT EXTRACTION W/ INTRAOCULAR LENS  IMPLANT, BILATERAL    . CYSTOSCOPY  01/2015  . LAPAROSCOPIC GASTRIC BANDING     Family History   Problem Relation Age of Onset  . Cancer Mother 93       liver/lung  . Heart disease Father 44  . Cancer Sister        lung   Social History   Socioeconomic History  . Marital status: Married    Spouse name: Not on file  . Number of children: Not on file  . Years of education: Not on file  . Highest education level: Not on file  Social Needs  . Financial resource strain: Not on file  . Food insecurity - worry: Not on file  . Food insecurity - inability: Not on file  . Transportation needs - medical: Not on file  . Transportation needs - non-medical: Not on file  Occupational History  . Not on file  Tobacco Use  . Smoking status: Former Smoker    Last attempt to quit: 12/13/1988    Years since quitting: 28.9  . Smokeless tobacco: Never Used  Substance and Sexual Activity  . Alcohol use: Yes    Comment: RARELY   . Drug use: No  . Sexual activity: Not on file  Other Topics Concern  . Not on file  Social History Narrative  . Not on file    Outpatient Encounter Medications as of 11/24/2017  Medication Sig  . ACCU-CHEK FASTCLIX LANCETS MISC Use one strip each time sugars are tested, pt  checks sugars once daily. Dx E11.9  . allopurinol (ZYLOPRIM) 300 MG tablet TAKE 1 TABLET (300 MG TOTAL) BY MOUTH DAILY.  Marland Kitchen amoxicillin (AMOXIL) 875 MG tablet Take 1 tablet (875 mg total) by mouth 2 (two) times daily.  Marland Kitchen aspirin 81 MG tablet Take 81 mg by mouth at bedtime.   . Blood Glucose Calibration (ACCU-CHEK SMARTVIEW CONTROL) LIQD Please use control solution as needed.  . Blood Glucose Monitoring Suppl (ACCU-CHEK NANO SMARTVIEW) w/Device KIT Please use device to check sugars once daily. Dx E11.9  . cetirizine (ZYRTEC) 10 MG tablet Take 1 tablet (10 mg total) by mouth daily.  . colchicine 0.6 MG tablet Take 1 tablet (0.6 mg total) by mouth daily.  . diclofenac sodium (VOLTAREN) 1 % GEL Apply 2 g topically daily as needed (pain).   Marland Kitchen diphenhydramine-acetaminophen (TYLENOL PM) 25-500 MG  TABS tablet Take 1 tablet by mouth at bedtime as needed.  . gabapentin (NEURONTIN) 600 MG tablet Take 600 mg by mouth 2 (two) times daily.  Marland Kitchen glimepiride (AMARYL) 1 MG tablet TAKE 1 TABLET EVERY DAY WITH BREAKFAST  . glucose blood (ACCU-CHEK SMARTVIEW) test strip Use one strip each time sugars are tested. Pt checks levels once daily. Dx E11.9  . hydrochlorothiazide (HYDRODIURIL) 12.5 MG tablet TAKE 1 TABLET DAILY. DOSE CHANGE  . meclizine (ANTIVERT) 25 MG tablet Take 1 tablet (25 mg total) by mouth 3 (three) times daily as needed for dizziness.  . meloxicam (MOBIC) 15 MG tablet TAKE 1 TABLET EVERY DAY  . Menthol, Topical Analgesic, (ICY HOT) 7.5 % (Roll) MISC Apply 1 each topically daily as needed.  . metFORMIN (GLUCOPHAGE) 500 MG tablet TAKE 1 TABLET FOUR TIMES DAILY  . Multiple Vitamin (MULTIVITAMIN WITH MINERALS) TABS tablet Take 1 tablet by mouth daily.  . pravastatin (PRAVACHOL) 40 MG tablet TAKE 1 TABLET EVERY DAY  . quinapril (ACCUPRIL) 40 MG tablet TAKE 1 TABLET (40 MG TOTAL) BY MOUTH DAILY.  . vitamin E 400 UNIT capsule Take 400 Units by mouth daily.   No facility-administered encounter medications on file as of 11/24/2017.     Activities of Daily Living In your present state of health, do you have any difficulty performing the following activities: 05/18/2017  Hearing? Y  Comment hearing aids  Vision? N  Difficulty concentrating or making decisions? N  Walking or climbing stairs? N  Dressing or bathing? N  Doing errands, shopping? N  Some recent data might be hidden     Patient Care Team: Midge Minium, MD as PCP - General (Family Medicine) Almedia Balls, MD as Consulting Physician (Orthopedic Surgery) Gavin Pound, MD as Consulting Physician (Rheumatology) Puschinsky, Fransico Him., MD as Consulting Physician (Urology) Levy Sjogren, MD as Referring Physician (Dermatology)   Assessment:    Physical assessment deferred to PCP.  Exercise Activities and  Dietary recommendations   Diet (meal preparation, eat out, water intake, caffeinated beverages, dairy products, fruits and vegetables):   Breakfast: Lunch:  Dinner:      Goals    None     Fall Risk Fall Risk  05/18/2017 11/17/2016 11/14/2015 02/20/2015  Falls in the past year? No No No No   Depression Screen PHQ 2/9 Scores 05/18/2017 11/17/2016 11/14/2015 02/20/2015  PHQ - 2 Score 0 0 0 1  PHQ- 9 Score 0 0 - -    Cognitive Function        Immunization History  Administered Date(s) Administered  . Influenza,inj,Quad PF,6+ Mos 08/13/2014, 11/14/2015, 10/07/2016  . Pneumococcal Conjugate-13  11/14/2015  . Pneumococcal Polysaccharide-23 08/13/2014  . Tdap 02/19/2010  . Zoster 02/20/2012   Screening Tests Health Maintenance  Topic Date Due  . Hepatitis C Screening  03-26-1946  . OPHTHALMOLOGY EXAM  11/17/2017  . INFLUENZA VACCINE  03/13/2018 (Originally 07/13/2017)  . HEMOGLOBIN A1C  03/09/2018  . FOOT EXAM  05/18/2018  . TETANUS/TDAP  02/20/2020  . COLONOSCOPY  12/22/2022  . PNA vac Low Risk Adult  Completed      Plan:     I have personally reviewed and noted the following in the patient's chart:   . Medical and social history . Use of alcohol, tobacco or illicit drugs  . Current medications and supplements . Functional ability and status . Nutritional status . Physical activity . Advanced directives . List of other physicians . Hospitalizations, surgeries, and ER visits in previous 12 months . Vitals . Screenings to include cognitive, depression, and falls . Referrals and appointments  In addition, I have reviewed and discussed with patient certain preventive protocols, quality metrics, and best practice recommendations. A written personalized care plan for preventive services as well as general preventive health recommendations were provided to patient.     Gerilyn Nestle, RN  11/23/2017

## 2017-11-24 ENCOUNTER — Ambulatory Visit: Payer: Medicare HMO

## 2017-11-29 DIAGNOSIS — N35912 Unspecified bulbous urethral stricture, male: Secondary | ICD-10-CM | POA: Diagnosis not present

## 2017-11-29 DIAGNOSIS — C679 Malignant neoplasm of bladder, unspecified: Secondary | ICD-10-CM | POA: Diagnosis not present

## 2017-11-29 NOTE — Progress Notes (Addendum)
Subjective:   John Bonilla is a 71 y.o. male who presents for Medicare Annual/Subsequent preventive examination.  Review of Systems:  No ROS.  Medicare Wellness Visit. Additional risk factors are reflected in the social history.  Cardiac Risk Factors include: advanced age (>64mn, >>70women);obesity (BMI >30kg/m2);sedentary lifestyle;diabetes mellitus;dyslipidemia;hypertension;male gender;family history of premature cardiovascular disease   Sleep patterns: Sleeps 7 hours, uses CPAP.  Home Safety/Smoke Alarms: Feels safe in home. Smoke alarms in place.  Living environment; residence and Firearm Safety: Lives with wife in 1 story home.  Seat Belt Safety/Bike Helmet: Wears seat belt.    Male:   CCS-Colonoscopy 12/22/2012, pt reports normal.   PSA- Followed by Urology, Puschinsky.       Objective:    Vitals: BP (!) 150/90 (BP Location: Left Arm, Patient Position: Sitting, Cuff Size: Large)   Pulse 62   Ht '5\' 10"'  (1.778 m)   Wt 278 lb (126.1 kg)   SpO2 96%   BMI 39.89 kg/m   Body mass index is 39.89 kg/m.  Advanced Directives 11/30/2017 11/19/2015  Does Patient Have a Medical Advance Directive? Yes No  Type of [place]aramed[place]of [place] will -  [place] of [place]? No - copy requested -  Would patient like information on creating a medical advance directive? - Yes - EScientist, clinical (histocompatibility and immunogenetics)given    Tobacco Social History   Tobacco Use  Smoking Status Former Smoker  . Last attempt to quit: 12/13/1988  . Years since quitting: 28.9  Smokeless Tobacco Never Used     Counseling given: Not Answered   Past Medical History:  Diagnosis Date  . Arthritis   . Cancer (HCC)    BLADDER+ SKIN   . [place]   . GERD (gastroesophageal reflux disease)    RESOLVED   . History of hiatal hernia   . History of kidney stones   . Hyperlipidemia   . Hypertension   . Morbid obesity ([place]   .  Shortness of breath dyspnea    WITH EXERTION   . Sleep apnea    CPAP    Past Surgical History:  Procedure Laterality Date  . ANTERIOR CERVICAL DECOMP/DISCECTOMY FUSION N/A 11/26/2015   Procedure: C5-6 ANTERIOR CERVICAL DECOMPRESSION/DISCECTOMY FUSION ;  Surgeon: DEustace Moore MD;  [facility];  Service: Neurosurgery;  Laterality: N/A;  C5-6 ANTERIOR CERVICAL DECOMPRESSION/DISCECTOMY FUSION   . CATARACT EXTRACTION W/ INTRAOCULAR LENS  IMPLANT, BILATERAL    . CYSTOSCOPY  01/2015  . LAPAROSCOPIC GASTRIC BANDING     Family History  Problem Relation Age of Onset  . Cancer Mother 457      liver/lung  . Heart disease Father 419 . Cancer Sister        lung   Social History   Socioeconomic History  . Marital status: Married    Spouse name: None  . Number of children: None  . Years of education: None  . Highest education level: None  Social Needs  . Financial resource strain: None  . Food insecurity - worry: None  . Food insecurity - inability: None  . Transportation needs - medical: None  . Transportation needs - non-medical: None  Occupational History  . None  Tobacco Use  . Smoking status: Former Smoker    Last attempt to quit: 12/13/1988    Years since quitting: 28.9  . Smokeless tobacco: Never Used  Substance and Sexual Activity  . Alcohol  use: Yes    Comment: RARELY   . Drug use: No  . Sexual activity: None  Other Topics Concern  . None  Social History Narrative  . None    Outpatient Encounter Medications as of 11/30/2017  Medication Sig  . ACCU-CHEK FASTCLIX LANCETS MISC Use one strip each time sugars are tested, pt checks sugars once daily. Dx E11.9  . allopurinol (ZYLOPRIM) 300 MG tablet TAKE 1 TABLET (300 MG TOTAL) BY MOUTH DAILY. (Patient taking differently: Take 450 mg by mouth daily. )  . aspirin 81 MG tablet Take 81 mg by mouth at bedtime.   . Blood Glucose Calibration (ACCU-CHEK SMARTVIEW CONTROL) LIQD Please use control solution as needed.  .  Blood Glucose Monitoring Suppl (ACCU-CHEK NANO SMARTVIEW) w/Device KIT Please use device to check sugars once daily. Dx E11.9  . cetirizine (ZYRTEC) 10 MG tablet Take 1 tablet (10 mg total) by mouth daily.  . colchicine 0.6 MG tablet Take 1 tablet (0.6 mg total) by mouth daily.  . diclofenac sodium (VOLTAREN) 1 % GEL Apply 2 g topically daily as needed (pain).   Marland Kitchen diphenhydramine-acetaminophen (TYLENOL PM) 25-500 MG TABS tablet Take 1 tablet by mouth at bedtime as needed.  . gabapentin (NEURONTIN) 600 MG tablet Take 600 mg by mouth 2 (two) times daily.  Marland Kitchen glimepiride (AMARYL) 1 MG tablet TAKE 1 TABLET EVERY DAY WITH BREAKFAST  . glucose blood (ACCU-CHEK SMARTVIEW) test strip Use one strip each time sugars are tested. Pt checks levels once daily. Dx E11.9  . hydrochlorothiazide (HYDRODIURIL) 12.5 MG tablet TAKE 1 TABLET DAILY. DOSE CHANGE  . meclizine (ANTIVERT) 25 MG tablet Take 1 tablet (25 mg total) by mouth 3 (three) times daily as needed for dizziness.  . meloxicam (MOBIC) 15 MG tablet TAKE 1 TABLET EVERY DAY  . Menthol, Topical Analgesic, (ICY HOT) 7.5 % (Roll) MISC Apply 1 each topically daily as needed.  . metFORMIN (GLUCOPHAGE) 500 MG tablet TAKE 1 TABLET FOUR TIMES DAILY  . Multiple Vitamin (MULTIVITAMIN WITH MINERALS) TABS tablet Take 1 tablet by mouth daily.  . pravastatin (PRAVACHOL) 40 MG tablet TAKE 1 TABLET EVERY DAY  . quinapril (ACCUPRIL) 40 MG tablet TAKE 1 TABLET (40 MG TOTAL) BY MOUTH DAILY.  . vitamin E 400 UNIT capsule Take 400 Units by mouth daily.  . tamsulosin (FLOMAX) 0.4 MG CAPS capsule take 1 capsule by mouth once daily 30 MINUTES FOLLOWING THE SAME MEAL EACH DAY  . Zoster Vaccine Adjuvanted [facility]) injection Inject 0.5 mLs into the muscle once for 1 dose.  . [DISCONTINUED] amoxicillin (AMOXIL) 875 MG tablet Take 1 tablet (875 mg total) by mouth 2 (two) times daily.   No facility-administered encounter medications on file as of 11/30/2017.     Activities of  Daily Living In your present state of health, do you have any difficulty performing the following activities: 11/30/2017 05/18/2017  Hearing? N Y  Comment - hearing aids  Vision? N N  Difficulty concentrating or making decisions? N N  Walking or climbing stairs? N N  Dressing or bathing? N N  Doing errands, shopping? N N  Preparing Food and eating ? N -  Using the Toilet? N -  In the past six months, have you accidently leaked urine? N -  Do you have problems with loss of bowel control? N -  Managing your Medications? N -  Managing your Finances? N -  Housekeeping or managing your Housekeeping? N -  Some recent data might be hidden  Patient Care Team: Midge Minium, MD as PCP - General (Family Medicine) Almedia Balls, MD as Consulting Physician (Orthopedic Surgery) Gavin Pound, MD as Consulting Physician (Rheumatology) Puschinsky, Fransico Him., MD as Consulting Physician (Urology) Levy Sjogren, MD as Referring Physician (Dermatology)   Assessment:   This is a routine wellness examination for [place].  Exercise Activities and Dietary recommendations Current Exercise Habits: The patient does not participate in regular exercise at present, Exercise limited by: None identified Diet (meal preparation, eat out, water intake, caffeinated beverages, dairy products, fruits and vegetables): Drinks water, un sweet tea and coffee  Breakfast: sausage and egg, coffee Lunch: low carb Dinner: meat and vegetables.  Snacks: popcorn  Goals    . Weight (lb) < 250 lb (113.4 kg)     Lose weight by watching diet.        Fall Risk Fall Risk  11/30/2017 05/18/2017 11/17/2016 11/14/2015 02/20/2015  Falls in the past year? No No No No No    Depression Screen PHQ 2/9 Scores 11/30/2017 05/18/2017 11/17/2016 11/14/2015  PHQ - 2 Score 0 0 0 0  PHQ- 9 Score - 0 0 -    Cognitive Function MMSE - Mini Mental State Exam 11/30/2017  Orientation to time 5  Orientation to Place 5    Registration 3  Attention/ Calculation 5  Recall 2  Language- name 2 objects 2  Language- repeat 1  Language- follow 3 step command 3  Language- read & follow direction 1  Write a sentence 1  Copy design 1  Total score 29        Immunization History  Administered Date(s) Administered  . Influenza,inj,Quad PF,6+ Mos 08/13/2014, 11/14/2015, 10/07/2016  . Pneumococcal Conjugate-13 11/14/2015  . Pneumococcal Polysaccharide-23 08/13/2014  . Tdap 02/19/2010  . Zoster 02/20/2012    Screening Tests Health Maintenance  Topic Date Due  . OPHTHALMOLOGY EXAM  11/17/2017  . INFLUENZA VACCINE  03/13/2018 (Originally 07/13/2017)  . Hepatitis C Screening  11/30/2018 (Originally 1946-08-30)  . HEMOGLOBIN A1C  03/09/2018  . FOOT EXAM  05/18/2018  . TETANUS/TDAP  02/20/2020  . COLONOSCOPY  12/22/2022  . PNA vac Low Risk Adult  Completed      Plan:     Shingles vaccine at pharmacy.   Continue doing brain stimulating activities (puzzles, reading, adult coloring books, staying active) to keep memory sharp.   Bring a copy of your living will and/or healthcare power of attorney to your next office visit.  I have personally reviewed and noted the following in the patient's chart:   . Medical and social history . Use of alcohol, tobacco or illicit drugs  . Current medications and supplements . Functional ability and status . Nutritional status . Physical activity . Advanced directives . List of other physicians . Hospitalizations, surgeries, and ER visits in previous 12 months . Vitals . Screenings to include cognitive, depression, and falls . Referrals and appointments  In addition, I have reviewed and discussed with patient certain preventive protocols, quality metrics, and best practice recommendations. A written personalized care plan for preventive services as well as general preventive health recommendations were provided to patient.     Gerilyn Nestle,  RN  11/30/2017  PCP Notes: -Pt had procedure (cystoscopy) on 11/29/17 at Urology. Also reports scarring on prostate. Office notes requested.  -BP 150/90. Advised to monitor BP at home, bring readings to next appointment.  -Next appt with PCP 12/2017 (CPE)  Reviewed documentation provided by RN and and will follow up  on issues at CPE.  Annye Asa, MD

## 2017-11-30 ENCOUNTER — Other Ambulatory Visit: Payer: Self-pay

## 2017-11-30 ENCOUNTER — Ambulatory Visit (INDEPENDENT_AMBULATORY_CARE_PROVIDER_SITE_OTHER): Payer: Medicare HMO

## 2017-11-30 VITALS — BP 148/90 | HR 62 | Ht 70.0 in | Wt 278.0 lb

## 2017-11-30 DIAGNOSIS — Z23 Encounter for immunization: Secondary | ICD-10-CM | POA: Diagnosis not present

## 2017-11-30 DIAGNOSIS — Z0000 Encounter for general adult medical examination without abnormal findings: Secondary | ICD-10-CM | POA: Diagnosis not present

## 2017-11-30 MED ORDER — ZOSTER VAC RECOMB ADJUVANTED 50 MCG/0.5ML IM SUSR: 0.5000 mL | Freq: Once | INTRAMUSCULAR | 1 refills | Status: AC

## 2017-11-30 NOTE — Progress Notes (Signed)
RN MWV note reviewed in PCP absence.  Ajax Schroll Cody Masaji Billups, PA-C  

## 2017-11-30 NOTE — Patient Instructions (Addendum)
Shingles vaccine at pharmacy.   Continue doing brain stimulating activities (puzzles, reading, adult coloring books, staying active) to keep memory sharp.   Bring a copy of your living will and/or healthcare power of attorney to your next office visit.   Fall Prevention in the Home Falls can cause injuries. They can happen to people of all ages. There are many things you can do to make your home safe and to help prevent falls. What can I do on the outside of my home?  Regularly fix the edges of walkways and driveways and fix any cracks.  Remove anything that might make you trip as you walk through a door, such as a raised step or threshold.  Trim any bushes or trees on the path to your home.  Use bright outdoor lighting.  Clear any walking paths of anything that might make someone trip, such as rocks or tools.  Regularly check to see if handrails are loose or broken. Make sure that both sides of any steps have handrails.  Any raised decks and porches should have guardrails on the edges.  Have any leaves, snow, or ice cleared regularly.  Use sand or salt on walking paths during winter.  Clean up any spills in your garage right away. This includes oil or grease spills. What can I do in the bathroom?  Use night lights.  Install grab bars by the toilet and in the tub and shower. Do not use towel bars as grab bars.  Use non-skid mats or decals in the tub or shower.  If you need to sit down in the shower, use a plastic, non-slip stool.  Keep the floor dry. Clean up any water that spills on the floor as soon as it happens.  Remove soap buildup in the tub or shower regularly.  Attach bath mats securely with double-sided non-slip rug tape.  Do not have throw rugs and other things on the floor that can make you trip. What can I do in the bedroom?  Use night lights.  Make sure that you have a light by your bed that is easy to reach.  Do not use any sheets or blankets that  are too big for your bed. They should not hang down onto the floor.  Have a firm chair that has side arms. You can use this for support while you get dressed.  Do not have throw rugs and other things on the floor that can make you trip. What can I do in the kitchen?  Clean up any spills right away.  Avoid walking on wet floors.  Keep items that you use a lot in easy-to-reach places.  If you need to reach something above you, use a strong step stool that has a grab bar.  Keep electrical cords out of the way.  Do not use floor polish or wax that makes floors slippery. If you must use wax, use non-skid floor wax.  Do not have throw rugs and other things on the floor that can make you trip. What can I do with my stairs?  Do not leave any items on the stairs.  Make sure that there are handrails on both sides of the stairs and use them. Fix handrails that are broken or loose. Make sure that handrails are as long as the stairways.  Check any carpeting to make sure that it is firmly attached to the stairs. Fix any carpet that is loose or worn.  Avoid having throw rugs at the top   or bottom of the stairs. If you do have throw rugs, attach them to the floor with carpet tape.  Make sure that you have a light switch at the top of the stairs and the bottom of the stairs. If you do not have them, ask someone to add them for you. What else can I do to help prevent falls?  Wear shoes that: ? Do not have high heels. ? Have rubber bottoms. ? Are comfortable and fit you well. ? Are closed at the toe. Do not wear sandals.  If you use a stepladder: ? Make sure that it is fully opened. Do not climb a closed stepladder. ? Make sure that both sides of the stepladder are locked into place. ? Ask someone to hold it for you, if possible.  Clearly mark and make sure that you can see: ? Any grab bars or handrails. ? First and last steps. ? Where the edge of each step is.  Use tools that help you  move around (mobility aids) if they are needed. These include: ? Canes. ? Walkers. ? Scooters. ? Crutches.  Turn on the lights when you go into a dark area. Replace any light bulbs as soon as they burn out.  Set up your furniture so you have a clear path. Avoid moving your furniture around.  If any of your floors are uneven, fix them.  If there are any pets around you, be aware of where they are.  Review your medicines with your doctor. Some medicines can make you feel dizzy. This can increase your chance of falling. Ask your doctor what other things that you can do to help prevent falls. This information is not intended to replace advice given to you by your health care provider. Make sure you discuss any questions you have with your health care provider. Document Released: 09/25/2009 Document Revised: 05/06/2016 Document Reviewed: 01/03/2015 Elsevier Interactive Patient Education  2018 Post Maintenance, Male A healthy lifestyle and preventive care is important for your health and wellness. Ask your health care provider about what schedule of regular examinations is right for you. What should I know about weight and diet? Eat a Healthy Diet  Eat plenty of vegetables, fruits, whole grains, low-fat dairy products, and lean protein.  Do not eat a lot of foods high in solid fats, added sugars, or salt.  Maintain a Healthy Weight Regular exercise can help you achieve or maintain a healthy weight. You should:  Do at least 150 minutes of exercise each week. The exercise should increase your heart rate and make you sweat (moderate-intensity exercise).  Do strength-training exercises at least twice a week.  Watch Your Levels of Cholesterol and Blood Lipids  Have your blood tested for lipids and cholesterol every 5 years starting at 71 years of age. If you are at high risk for heart disease, you should start having your blood tested when you are 71 years old. You may  need to have your cholesterol levels checked more often if: ? Your lipid or cholesterol levels are high. ? You are older than 71 years of age. ? You are at high risk for heart disease.  What should I know about cancer screening? Many types of cancers can be detected early and may often be prevented. Lung Cancer  You should be screened every year for lung cancer if: ? You are a current smoker who has smoked for at least 30 years. ? You are a former smoker  who has quit within the past 15 years.  Talk to your health care provider about your screening options, when you should start screening, and how often you should be screened.  Colorectal Cancer  Routine colorectal cancer screening usually begins at 71 years of age and should be repeated every 5-10 years until you are 71 years old. You may need to be screened more often if early forms of precancerous polyps or small growths are found. Your health care provider may recommend screening at an earlier age if you have risk factors for colon cancer.  Your health care provider may recommend using home test kits to check for hidden blood in the stool.  A small camera at the end of a tube can be used to examine your colon (sigmoidoscopy or colonoscopy). This checks for the earliest forms of colorectal cancer.  Prostate and Testicular Cancer  Depending on your age and overall health, your health care provider may do certain tests to screen for prostate and testicular cancer.  Talk to your health care provider about any symptoms or concerns you have about testicular or prostate cancer.  Skin Cancer  Check your skin from head to toe regularly.  Tell your health care provider about any new moles or changes in moles, especially if: ? There is a change in a mole's size, shape, or color. ? You have a mole that is larger than a pencil eraser.  Always use sunscreen. Apply sunscreen liberally and repeat throughout the day.  Protect yourself by  wearing long sleeves, pants, a wide-brimmed hat, and sunglasses when outside.  What should I know about heart disease, diabetes, and high blood pressure?  If you are 33-49 years of age, have your blood pressure checked every 3-5 years. If you are 81 years of age or older, have your blood pressure checked every year. You should have your blood pressure measured twice-once when you are at a hospital or clinic, and once when you are not at a hospital or clinic. Record the average of the two measurements. To check your blood pressure when you are not at a hospital or clinic, you can use: ? An automated blood pressure machine at a pharmacy. ? A home blood pressure monitor.  Talk to your health care provider about your target blood pressure.  If you are between 60-24 years old, ask your health care provider if you should take aspirin to prevent heart disease.  Have regular diabetes screenings by checking your fasting blood sugar level. ? If you are at a normal weight and have a low risk for diabetes, have this test once every three years after the age of 32. ? If you are overweight and have a high risk for diabetes, consider being tested at a younger age or more often.  A one-time screening for abdominal aortic aneurysm (AAA) by ultrasound is recommended for men aged 56-75 years who are current or former smokers. What should I know about preventing infection? Hepatitis B If you have a higher risk for hepatitis B, you should be screened for this virus. Talk with your health care provider to find out if you are at risk for hepatitis B infection. Hepatitis C Blood testing is recommended for:  Everyone born from [birth year] through 1965.  Anyone with known risk factors for hepatitis C.  Sexually Transmitted Diseases (STDs)  You should be screened each year for STDs including gonorrhea and chlamydia if: ? You are sexually active and are younger than 71 years of age. ?  You are older than 71 years of age  and your health care provider tells you that you are at risk for this type of infection. ? Your sexual activity has changed since you were last screened and you are at an increased risk for chlamydia or gonorrhea. Ask your health care provider if you are at risk.  Talk with your health care provider about whether you are at high risk of being infected with HIV. Your health care provider may recommend a prescription medicine to help prevent HIV infection.  What else can I do?  Schedule regular health, dental, and eye exams.  Stay current with your vaccines (immunizations).  Do not use any tobacco products, such as cigarettes, chewing tobacco, and e-cigarettes. If you need help quitting, ask your health care provider.  Limit alcohol intake to no more than 2 drinks per day. One drink equals 12 ounces of beer, 5 ounces of wine, or 1 ounces of hard liquor.  Do not use street drugs.  Do not share needles.  Ask your health care provider for help if you need support or information about quitting drugs.  Tell your health care provider if you often feel depressed.  Tell your health care provider if you have ever been abused or do not feel safe at home. This information is not intended to replace advice given to you by your health care provider. Make sure you discuss any questions you have with your health care provider. Document Released: 05/27/2008 Document Revised: 07/28/2016 Document Reviewed: 09/02/2015 Elsevier Interactive Patient Education  [employer].

## 2017-12-15 DIAGNOSIS — C679 Malignant neoplasm of bladder, unspecified: Secondary | ICD-10-CM | POA: Diagnosis not present

## 2017-12-15 DIAGNOSIS — R339 Retention of urine, unspecified: Secondary | ICD-10-CM | POA: Diagnosis not present

## 2017-12-20 ENCOUNTER — Ambulatory Visit (INDEPENDENT_AMBULATORY_CARE_PROVIDER_SITE_OTHER): Payer: Medicare HMO | Admitting: Family Medicine

## 2017-12-20 ENCOUNTER — Other Ambulatory Visit: Payer: Self-pay

## 2017-12-20 ENCOUNTER — Encounter: Payer: Self-pay | Admitting: Family Medicine

## 2017-12-20 ENCOUNTER — Other Ambulatory Visit: Payer: Self-pay | Admitting: General Practice

## 2017-12-20 VITALS — BP 131/82 | HR 82 | Temp 98.1°F | Resp 16 | Ht 70.0 in | Wt 275.0 lb

## 2017-12-20 DIAGNOSIS — Z0000 Encounter for general adult medical examination without abnormal findings: Secondary | ICD-10-CM | POA: Diagnosis not present

## 2017-12-20 DIAGNOSIS — E785 Hyperlipidemia, unspecified: Secondary | ICD-10-CM | POA: Diagnosis not present

## 2017-12-20 DIAGNOSIS — E1169 Type 2 diabetes mellitus with other specified complication: Secondary | ICD-10-CM | POA: Diagnosis not present

## 2017-12-20 DIAGNOSIS — E1140 Type 2 diabetes mellitus with diabetic neuropathy, unspecified: Secondary | ICD-10-CM

## 2017-12-20 LAB — CBC WITH DIFFERENTIAL/PLATELET
BASOS ABS: 0.1 10*3/uL (ref 0.0–0.1)
Basophils Relative: 1.3 % (ref 0.0–3.0)
Eosinophils Absolute: 0.3 10*3/uL (ref 0.0–0.7)
Eosinophils Relative: 4.8 % (ref 0.0–5.0)
HCT: 46 % (ref 39.0–52.0)
Hemoglobin: 15.3 g/dL (ref 13.0–17.0)
Lymphocytes Relative: 20.1 % (ref 12.0–46.0)
Lymphs Abs: 1.3 10*3/uL (ref 0.7–4.0)
MCHC: 33.2 g/dL (ref 30.0–36.0)
MCV: 105.9 fl — AB (ref 78.0–100.0)
MONO ABS: 0.6 10*3/uL (ref 0.1–1.0)
MONOS PCT: 9.2 % (ref 3.0–12.0)
NEUTROS ABS: 4.1 10*3/uL (ref 1.4–7.7)
NEUTROS PCT: 64.6 % (ref 43.0–77.0)
PLATELETS: 237 10*3/uL (ref 150.0–400.0)
RBC: 4.34 Mil/uL (ref 4.22–5.81)
RDW: 14.6 % (ref 11.5–15.5)
WBC: 6.4 10*3/uL (ref 4.0–10.5)

## 2017-12-20 LAB — BASIC METABOLIC PANEL
BUN: 16 mg/dL (ref 6–23)
CHLORIDE: 98 meq/L (ref 96–112)
CO2: 32 mEq/L (ref 19–32)
Calcium: 9.4 mg/dL (ref 8.4–10.5)
Creatinine, Ser: 0.85 mg/dL (ref 0.40–1.50)
GFR: 94.36 mL/min (ref >60.00)
GLUCOSE: 152 mg/dL — AB (ref 70–99)
POTASSIUM: 4.3 meq/L (ref 3.5–5.1)
SODIUM: 138 meq/L (ref 135–145)

## 2017-12-20 LAB — LIPID PANEL
CHOLESTEROL: 145 mg/dL (ref 0–200)
HDL: 49 mg/dL (ref >39.00)
LDL CALC: 76 mg/dL (ref 0–99)
NonHDL: 96.4
TRIGLYCERIDES: 102 mg/dL (ref 0.0–149.0)
Total CHOL/HDL Ratio: 3
VLDL: 20.4 mg/dL (ref 0.0–40.0)

## 2017-12-20 LAB — HEPATIC FUNCTION PANEL
ALBUMIN: 4.5 g/dL (ref 3.5–5.2)
ALK PHOS: 62 U/L (ref 39–117)
ALT: 20 U/L (ref 0–53)
AST: 21 U/L (ref 0–37)
BILIRUBIN DIRECT: 0.2 mg/dL (ref 0.0–0.3)
TOTAL PROTEIN: 6.9 g/dL (ref 6.0–8.3)
Total Bilirubin: 0.7 mg/dL (ref 0.2–1.2)

## 2017-12-20 LAB — TSH: TSH: 2.4 u[IU]/mL (ref 0.35–4.50)

## 2017-12-20 LAB — HEMOGLOBIN A1C: Hgb A1c MFr Bld: 6.8 % — ABNORMAL HIGH (ref 4.6–6.5)

## 2017-12-20 MED ORDER — COLCHICINE 0.6 MG PO TABS: 0.6000 mg | ORAL_TABLET | Freq: Every day | ORAL | 1 refills | Status: DC

## 2017-12-20 MED ORDER — MECLIZINE HCL 25 MG PO TABS: 25.0000 mg | ORAL_TABLET | Freq: Three times a day (TID) | ORAL | 0 refills | Status: DC | PRN

## 2017-12-20 MED ORDER — PRAVASTATIN SODIUM 40 MG PO TABS: 40.0000 mg | ORAL_TABLET | Freq: Every day | ORAL | 1 refills | Status: DC

## 2017-12-20 MED ORDER — ACCU-CHEK FASTCLIX LANCETS MISC: 1 refills | Status: DC

## 2017-12-20 MED ORDER — HYDROCHLOROTHIAZIDE 12.5 MG PO TABS: ORAL_TABLET | ORAL | 1 refills | Status: DC

## 2017-12-20 MED ORDER — MELOXICAM 15 MG PO TABS: 15.0000 mg | ORAL_TABLET | Freq: Every day | ORAL | 1 refills | Status: DC

## 2017-12-20 MED ORDER — ALLOPURINOL 300 MG PO TABS: 450.0000 mg | ORAL_TABLET | Freq: Every day | ORAL | 1 refills | Status: DC

## 2017-12-20 MED ORDER — ACCU-CHEK SMARTVIEW CONTROL VI LIQD: 1 refills | Status: DC

## 2017-12-20 MED ORDER — GLIMEPIRIDE 1 MG PO TABS: ORAL_TABLET | ORAL | 1 refills | Status: DC

## 2017-12-20 MED ORDER — COLCHICINE 0.6 MG PO TABS: 0.6000 mg | ORAL_TABLET | Freq: Every day | ORAL | 1 refills | Status: AC

## 2017-12-20 MED ORDER — METFORMIN HCL 500 MG PO TABS: ORAL_TABLET | ORAL | 1 refills | Status: DC

## 2017-12-20 MED ORDER — GLUCOSE BLOOD VI STRP: ORAL_STRIP | 1 refills | Status: DC

## 2017-12-20 NOTE — Assessment & Plan Note (Signed)
Ongoing issue for pt.  Stressed need for healthy diet and regular exercise  Check labs to risk stratify.  Will follow closely 

## 2017-12-20 NOTE — Assessment & Plan Note (Signed)
Chronic problem.  On Pravastatin daily w/o difficulty.  Stressed need for healthy diet and regular exercise.  Check labs.  Adjust meds prn

## 2017-12-20 NOTE — Assessment & Plan Note (Signed)
Chronic problem.  UTD on foot exam, eye exam, and on ACE for renal protection.  Check labs.  Adjust meds prn  °

## 2017-12-20 NOTE — Progress Notes (Signed)
   Subjective:    Patient ID: John Bonilla, male    DOB: [birth year], 72 y.o.   MRN: [id number]  HPI CPE- UTD on colonoscopy, UTD on eye exam, foot exam.  UTD on immunizations.   Review of Systems Patient reports no vision/hearing changes, anorexia, fever ,adenopathy, persistant/recurrent hoarseness, swallowing issues, chest pain, palpitations, edema, persistant/recurrent cough, hemoptysis, dyspnea (rest,exertional, paroxysmal nocturnal), gastrointestinal  bleeding (melena, rectal bleeding), abdominal pain, excessive heart burn, syncope, focal weakness, memory loss, skin/hair/nail changes, depression, anxiety, abnormal bruising/bleeding, musculoskeletal symptoms/signs.   + urinary issues- following w/ urology + chronic numbness of feet    Objective:   Physical Exam General Appearance:    Alert, cooperative, no distress, appears stated age, obese  Head:    Normocephalic, without obvious abnormality, atraumatic  Eyes:    PERRL, conjunctiva/corneas clear, EOM's intact, fundi    benign, both eyes       Ears:    Normal TM's and external ear canals, both ears  Nose:   Nares normal, septum midline, mucosa normal, no drainage   or sinus tenderness  Throat:   Lips, mucosa, and tongue normal; teeth and gums normal  Neck:   Supple, symmetrical, trachea midline, no adenopathy;       thyroid:  No enlargement/tenderness/nodules  Back:     Symmetric, no curvature, ROM normal, no CVA tenderness  Lungs:     Clear to auscultation bilaterally, respirations unlabored  Chest wall:    No tenderness or deformity  Heart:    Regular rate and rhythm, S1 and S2 normal, no murmur, rub   or gallop  Abdomen:     Soft, non-tender, bowel sounds active all four quadrants,    no masses, no organomegaly  Genitalia:    Deferred to urology  Rectal:    Extremities:   Extremities normal, atraumatic, no cyanosis or edema  Pulses:   2+ and symmetric all extremities  Skin:   Skin color, texture, turgor normal, no  rashes or lesions  Lymph nodes:   Cervical, supraclavicular, and axillary nodes normal  Neurologic:   CNII-XII intact. Normal strength, sensation and reflexes      throughout          Assessment & Plan:

## 2017-12-20 NOTE — Patient Instructions (Addendum)
Follow up in 3-4 months to recheck diabetes We'll notify you of your lab results and make any changes if needed Continue to work on healthy diet and regular exercise- you can do it!! Call with any questions or concerns Happy New Year!!! 

## 2017-12-20 NOTE — Assessment & Plan Note (Signed)
Pt's PE unchanged from previous and WNL w/ exception of obesity.  UTD on colonoscopy, urology, and immunizations.  Check labs.  Anticipatory guidance provided.

## 2017-12-21 ENCOUNTER — Encounter: Payer: Self-pay | Admitting: General Practice

## 2017-12-22 DIAGNOSIS — R69 Illness, unspecified: Secondary | ICD-10-CM | POA: Diagnosis not present

## 2017-12-28 ENCOUNTER — Telehealth: Payer: Self-pay | Admitting: General Practice

## 2017-12-28 MED ORDER — ONETOUCH VERIO HIGH VI SOLN: 3 refills | Status: AC

## 2017-12-28 MED ORDER — ONETOUCH DELICA LANCETS 33G MISC: 12 refills | Status: AC

## 2017-12-28 MED ORDER — GLUCOSE BLOOD VI STRP: ORAL_STRIP | 12 refills | Status: AC

## 2017-12-28 MED ORDER — ONETOUCH VERIO W/DEVICE KIT: 1.0000 | PACK | Freq: Every day | 2 refills | Status: AC

## 2017-12-28 MED ORDER — ONETOUCH DELICA LANCING DEV MISC: 3 refills | Status: AC

## 2017-12-28 NOTE — Addendum Note (Signed)
Addended by: Davis Gourd on: 12/28/2017 03:53 PM   Modules accepted: Orders

## 2017-12-28 NOTE — Telephone Encounter (Signed)
Per CRM:   John Bonilla 12/28/2017 03:23 PM  Summary: returned call    Patient said ok to change and will pick up at pharmacy   New glucometer filled to pharmacy.

## 2017-12-28 NOTE — Telephone Encounter (Signed)
Received a fax from the pharmacy to inform that patient's accu-chek glucometer is no longer on formulary. They would prefer the patient to be on onetouch supplies.   Called pt to find out if ok to switch glucose products.

## 2017-12-29 DIAGNOSIS — R69 Illness, unspecified: Secondary | ICD-10-CM | POA: Diagnosis not present

## 2017-12-29 LAB — HM DIABETES EYE EXAM

## 2018-01-02 ENCOUNTER — Other Ambulatory Visit: Payer: Self-pay | Admitting: General Practice

## 2018-01-02 MED ORDER — HYDROCHLOROTHIAZIDE 12.5 MG PO TABS: ORAL_TABLET | ORAL | 1 refills | Status: DC

## 2018-01-04 DIAGNOSIS — R69 Illness, unspecified: Secondary | ICD-10-CM | POA: Diagnosis not present

## 2018-02-16 ENCOUNTER — Telehealth: Payer: Self-pay | Admitting: Family Medicine

## 2018-02-16 ENCOUNTER — Other Ambulatory Visit: Payer: Self-pay

## 2018-02-16 ENCOUNTER — Encounter: Payer: Self-pay | Admitting: Physician Assistant

## 2018-02-16 ENCOUNTER — Other Ambulatory Visit: Payer: Self-pay | Admitting: *Deleted

## 2018-02-16 ENCOUNTER — Ambulatory Visit (INDEPENDENT_AMBULATORY_CARE_PROVIDER_SITE_OTHER): Payer: Medicare HMO

## 2018-02-16 ENCOUNTER — Ambulatory Visit (INDEPENDENT_AMBULATORY_CARE_PROVIDER_SITE_OTHER): Payer: Medicare HMO | Admitting: Physician Assistant

## 2018-02-16 VITALS — BP 120/76 | HR 77 | Temp 98.4°F | Resp 16 | Ht 70.0 in | Wt 274.0 lb

## 2018-02-16 DIAGNOSIS — J310 Chronic rhinitis: Secondary | ICD-10-CM

## 2018-02-16 DIAGNOSIS — R0789 Other chest pain: Secondary | ICD-10-CM | POA: Diagnosis not present

## 2018-02-16 DIAGNOSIS — R0781 Pleurodynia: Secondary | ICD-10-CM

## 2018-02-16 MED ORDER — BENZONATATE 100 MG PO CAPS: 100.0000 mg | ORAL_CAPSULE | Freq: Two times a day (BID) | ORAL | 0 refills | Status: DC | PRN

## 2018-02-16 MED ORDER — FLUTICASONE PROPIONATE 50 MCG/ACT NA SUSP: 2.0000 | Freq: Every day | NASAL | 6 refills | Status: AC

## 2018-02-16 MED ORDER — DOXYCYCLINE HYCLATE 100 MG PO TABS: 100.0000 mg | ORAL_TABLET | Freq: Two times a day (BID) | ORAL | 0 refills | Status: AC

## 2018-02-16 NOTE — Telephone Encounter (Signed)
Copied from [place]. Topic: Quick Communication - Other Results >> Feb 16, 2018  5:16 PM John Bonilla, NT wrote: Patient called and would like results from chest x ray,and medicine has been called in to pharmacy also triage may disclose

## 2018-02-16 NOTE — Telephone Encounter (Signed)
Results given and documented in result note. 

## 2018-02-16 NOTE — Patient Instructions (Signed)
Please go to the [facility] office for an x-ray. I will call you with your results.  Please keep well-hydrated and get plenty of rest.  Take some plain Mucinex to help thin out any congestion.  Use the Tessalon as directed for cough.  Continue your allergy medication but start the Flonase daily as directed.   We will alter regimen further after I get your x-ray results shortly.   If you note any new or worsening symptoms, please return immediately or go to the ER for evaluation.

## 2018-02-16 NOTE — Progress Notes (Signed)
Patient presents to clinic today c/o a few weeks of nasal congestion, runny nose, watery eyes and cough that is sometimes productive of clear sputum. Patient with history of allergies, taking Zyrtec every so often. Denies sinus pain, ear pain or tooth pain. Denies fever, chills, malaise. Has noted a couple of episodes of quick R-sided chest pain, lasting a couple of seconds when he inhales after a coughing spell. Denies recent travel or sick contact.   Past Medical History:  Diagnosis Date  . Arthritis   . Cancer (HCC)    BLADDER+ SKIN   . Diabetes mellitus without complication ([place])   . GERD (gastroesophageal reflux disease)    RESOLVED   . History of hiatal hernia   . History of kidney stones   . Hyperlipidemia   . Hypertension   . Morbid obesity ([place])   . Shortness of breath dyspnea    WITH EXERTION   . Sleep apnea    CPAP     Current Outpatient Medications on File Prior to Visit  Medication Sig Dispense Refill  . allopurinol (ZYLOPRIM) 300 MG tablet Take 1.5 tablets (450 mg total) by mouth daily. 135 tablet 1  . Blood Glucose Calibration (ONETOUCH VERIO) High SOLN Use as directed. 1 each 3  . Blood Glucose Monitoring Suppl (ONETOUCH VERIO) w/Device KIT 1 each by Does not apply route daily. To test sugars. Dx. E11.9 1 kit 2  . cetirizine (ZYRTEC) 10 MG tablet Take 1 tablet (10 mg total) by mouth daily. 30 tablet 11  . colchicine 0.6 MG tablet Take 1 tablet (0.6 mg total) by mouth daily. 90 tablet 1  . diphenhydramine-acetaminophen (TYLENOL PM) 25-500 MG TABS tablet Take 1 tablet by mouth at bedtime as needed.    . gabapentin (NEURONTIN) 600 MG tablet Take 600 mg by mouth 2 (two) times daily.    Marland Kitchen glimepiride (AMARYL) 1 MG tablet TAKE 1 TABLET EVERY DAY WITH BREAKFAST 90 tablet 1  . glucose blood (ONETOUCH VERIO) test strip Use one strips to test sugars 1-2 times daily. Dx. E11.9 100 each 12  . hydrochlorothiazide (HYDRODIURIL) 12.5 MG tablet TAKE 1 TABLET DAILY. DOSE CHANGE 30  tablet 1  . [employer] (ONE TOUCH DELICA LANCING DEV) MISC Please use to test sugars once daily. 1 each 3  . meclizine (ANTIVERT) 25 MG tablet Take 1 tablet (25 mg total) by mouth 3 (three) times daily as needed for dizziness. 30 tablet 0  . meloxicam (MOBIC) 15 MG tablet Take 1 tablet (15 mg total) by mouth daily. 90 tablet 1  . Menthol, Topical Analgesic, (ICY HOT) 7.5 % (Roll) MISC Apply 1 each topically daily as needed.    . metFORMIN (GLUCOPHAGE) 500 MG tablet TAKE 1 TABLET FOUR TIMES DAILY 360 tablet 1  . Multiple Vitamin (MULTIVITAMIN WITH MINERALS) TABS tablet Take 1 tablet by mouth daily.    Glory Rosebush DELICA LANCETS 69G MISC Please use to test sugars once daily. Dx. E11.9 100 each 12  . pravastatin (PRAVACHOL) 40 MG tablet Take 1 tablet (40 mg total) by mouth daily. 90 tablet 1  . quinapril (ACCUPRIL) 40 MG tablet TAKE 1 TABLET (40 MG TOTAL) BY MOUTH DAILY. 90 tablet 1  . tamsulosin (FLOMAX) 0.4 MG CAPS capsule take 1 capsule by mouth once daily 30 MINUTES FOLLOWING THE SAME MEAL EACH DAY  0  . vitamin E 400 UNIT capsule Take 400 Units by mouth daily.    Marland Kitchen aspirin 81 MG tablet Take 81 mg by mouth  at bedtime.     . diclofenac sodium (VOLTAREN) 1 % GEL Apply 2 g topically daily as needed (pain).      No current facility-administered medications on file prior to visit.     No Known Allergies  Family History  Problem Relation Age of Onset  . Cancer Mother 75       liver/lung  . Heart disease Father 24  . Cancer Sister        lung    Social History   Socioeconomic History  . Marital status: Married    Spouse name: None  . Number of children: None  . Years of education: None  . Highest education level: None  Social Needs  . Financial resource strain: None  . Food insecurity - worry: None  . Food insecurity - inability: None  . Transportation needs - medical: None  . Transportation needs - non-medical: None  Occupational History  . None  Tobacco Use  . Smoking  status: Former Smoker    Last attempt to quit: 12/13/1988    Years since quitting: 29.1  . Smokeless tobacco: Never Used  Substance and Sexual Activity  . Alcohol use: Yes    Comment: RARELY   . Drug use: No  . Sexual activity: None  Other Topics Concern  . None  Social History Narrative  . None   Review of Systems - See HPI.  All other ROS are negative.  BP 120/76   Pulse 77   Temp 98.4 F (36.9 C) (Oral)   Resp 16   Ht '5\' 10"'  (1.778 m)   Wt 274 lb (124.3 kg)   SpO2 97%   BMI 39.31 kg/m   Physical Exam  Constitutional: He is oriented to person, place, and time and well-developed, well-nourished, and in no distress.  HENT:  Head: Normocephalic and atraumatic.  Right Ear: Tympanic membrane and external ear normal.  Left Ear: External ear normal. A middle ear effusion (serous) is present.  Nose: Mucosal edema and rhinorrhea present. Right sinus exhibits no maxillary sinus tenderness and no frontal sinus tenderness. Left sinus exhibits no maxillary sinus tenderness and no frontal sinus tenderness.  Mouth/Throat: Uvula is midline, oropharynx is clear and moist and mucous membranes are normal. No oropharyngeal exudate.  Eyes: Conjunctivae are normal.  Neck: Neck supple.  Cardiovascular: Normal rate, regular rhythm, normal heart sounds and intact distal pulses.  Pulmonary/Chest: Effort normal and breath sounds normal. No respiratory distress. He has no wheezes. He has no rales. He exhibits no tenderness.  Lymphadenopathy:    He has no cervical adenopathy.  Neurological: He is alert and oriented to person, place, and time.  Skin: Skin is warm and dry. No rash noted.  Psychiatric: Affect normal.  Vitals reviewed.  Recent Results (from the past 2160 hour(s))  Hemoglobin A1c     Status: Abnormal   Collection Time: 12/20/17 10:52 AM  Result Value Ref Range   Hgb A1c MFr Bld 6.8 (H) 4.6 - 6.5 %    Comment: Glycemic Control Guidelines for People with Diabetes:Non Diabetic:   <6%Goal of Therapy: <7%Additional Action Suggested:  >8%   Lipid panel     Status: None   Collection Time: 12/20/17 10:52 AM  Result Value Ref Range   Cholesterol 145 0 - 200 mg/dL    Comment: ATP III Classification       Desirable:  < 200 mg/dL               Borderline High:  200 - 239 mg/dL          High:  > = 240 mg/dL   Triglycerides 102.0 0.0 - 149.0 mg/dL    Comment: Normal:  <150 mg/dLBorderline High:  150 - 199 mg/dL   HDL 49.00 >39.00 mg/dL   VLDL 20.4 0.0 - 40.0 mg/dL   LDL Cholesterol 76 0 - 99 mg/dL   Total CHOL/HDL Ratio 3     Comment:                Men          Women1/2 Average Risk     3.4          3.3Average Risk          5.0          4.42X Average Risk          9.6          7.13X Average Risk          15.0          11.0                       NonHDL 96.40     Comment: NOTE:  Non-HDL goal should be 30 mg/dL higher than patient's LDL goal (i.e. LDL goal of < 70 mg/dL, would have non-HDL goal of < 100 mg/dL)  Basic metabolic panel     Status: Abnormal   Collection Time: 12/20/17 10:52 AM  Result Value Ref Range   Sodium 138 135 - 145 mEq/L   Potassium 4.3 3.5 - 5.1 mEq/L   Chloride 98 96 - 112 mEq/L   CO2 32 19 - 32 mEq/L   Glucose, Bld 152 (H) 70 - 99 mg/dL   BUN 16 6 - 23 mg/dL   Creatinine, Ser 0.85 0.40 - 1.50 mg/dL   Calcium 9.4 8.4 - 10.5 mg/dL   GFR 94.36 >60.00 mL/min  TSH     Status: None   Collection Time: 12/20/17 10:52 AM  Result Value Ref Range   TSH 2.40 0.35 - 4.50 uIU/mL  Hepatic function panel     Status: None   Collection Time: 12/20/17 10:52 AM  Result Value Ref Range   Total Bilirubin 0.7 0.2 - 1.2 mg/dL   Bilirubin, Direct 0.2 0.0 - 0.3 mg/dL   Alkaline Phosphatase 62 39 - 117 U/L   AST 21 0 - 37 U/L   ALT 20 0 - 53 U/L   Total Protein 6.9 6.0 - 8.3 g/dL   Albumin 4.5 3.5 - 5.2 g/dL  CBC with Differential/Platelet     Status: Abnormal   Collection Time: 12/20/17 10:52 AM  Result Value Ref Range   WBC 6.4 4.0 - 10.5 K/uL   RBC 4.34 4.22 -  5.81 Mil/uL   Hemoglobin 15.3 13.0 - 17.0 g/dL   HCT 46.0 39.0 - 52.0 %   MCV 105.9 (H) 78.0 - 100.0 fl   MCHC 33.2 30.0 - 36.0 g/dL   RDW 14.6 11.5 - 15.5 %   Platelets 237.0 150.0 - 400.0 K/uL   Neutrophils Relative % 64.6 43.0 - 77.0 %   Lymphocytes Relative 20.1 12.0 - 46.0 %   Monocytes Relative 9.2 3.0 - 12.0 %   Eosinophils Relative 4.8 0.0 - 5.0 %   Basophils Relative 1.3 0.0 - 3.0 %   Neutro Abs 4.1 1.4 - 7.7 K/uL   Lymphs Abs 1.3 0.7 - 4.0 K/uL   Monocytes  Absolute 0.6 0.1 - 1.0 K/uL   Eosinophils Absolute 0.3 0.0 - 0.7 K/uL   Basophils Absolute 0.1 0.0 - 0.1 K/uL    Assessment/Plan: 1. Chronic rhinitis Resume antihistamine. Start Flonase and [employer]. Supportive measures and OTC medications reviewed. - fluticasone (FLONASE) 50 MCG/ACT nasal spray; Place 2 sprays into both nostrils daily.  Dispense: 16 g; Refill: 6 - benzonatate (TESSALON) 100 MG capsule; Take 1 capsule (100 mg total) by mouth 2 (two) times daily as needed for cough.  Dispense: 20 capsule; Refill: 0  2. Pleuritic pain 1 episode after coughing spell. Exam unremarkable. Patient concerned due to history of pneumonia. Will check CXR today.  - DG Chest 2 View; Future    Leeanne Rio, PA-C

## 2018-02-28 ENCOUNTER — Other Ambulatory Visit: Payer: Self-pay

## 2018-02-28 ENCOUNTER — Encounter: Payer: Self-pay | Admitting: Family Medicine

## 2018-02-28 ENCOUNTER — Ambulatory Visit (INDEPENDENT_AMBULATORY_CARE_PROVIDER_SITE_OTHER): Payer: Medicare HMO | Admitting: Family Medicine

## 2018-02-28 DIAGNOSIS — J309 Allergic rhinitis, unspecified: Secondary | ICD-10-CM | POA: Insufficient documentation

## 2018-02-28 DIAGNOSIS — J301 Allergic rhinitis due to pollen: Secondary | ICD-10-CM

## 2018-02-28 MED ORDER — PREDNISONE 10 MG PO TABS: ORAL_TABLET | ORAL | 0 refills | Status: DC

## 2018-02-28 NOTE — Patient Instructions (Signed)
Follow up as needed or as scheduled Your symptoms are consistent w/ seasonal allergies CONTINUE the Cetirizine (Zyrtec) daily USE the Fluticasone (Flonase) nasal spray- 2 sprays each nostril daily Drink plenty of fluids START the Prednisone as directed- 3 pills at the same time x3 days, then 2 pills at the same time x3 days, and then 1 tab daily.  Take w/ food The prednisone can cause your sugars to rise- please be mindful of what you are eating Call with any questions or concerns Hang in there!!!

## 2018-02-28 NOTE — Progress Notes (Signed)
   Subjective:    Patient ID: John Bonilla, male    DOB: 1946-07-18, 72 y.o.   MRN: [id number]  HPI URI- pt was seen 3/7 and dx'd w/ rhinitis.  Wife feels that 'a lot of my symptoms are allergies'.  + head congestion, nasal congestion.  + cough- 'usually there's some phlegm'.  Pt is having 'point specific' chest pain- most commonly R diaphragm area.  Pt is 'very concerned' about possible PNA b/c he has hx of this.  No fevers.  Pt is not using the nasal spray, 'I don't use nasal sprays'.   Review of Systems For ROS see HPI     Objective:   Physical Exam  Constitutional: He appears well-developed and well-nourished. No distress.  HENT:  Head: Normocephalic and atraumatic.  No TTP over sinuses + turbinate edema + PND TMs normal bilaterally  Eyes: Pupils are equal, round, and reactive to light. Conjunctivae and EOM are normal.  Neck: Normal range of motion. Neck supple.  Cardiovascular: Normal rate, regular rhythm and normal heart sounds.  Pulmonary/Chest: Effort normal and breath sounds normal. No respiratory distress. He has no wheezes.  Lymphadenopathy:    He has no cervical adenopathy.  Skin: Skin is warm and dry.  Vitals reviewed.         Assessment & Plan:

## 2018-03-09 ENCOUNTER — Telehealth: Payer: Self-pay | Admitting: Family Medicine

## 2018-03-09 ENCOUNTER — Other Ambulatory Visit: Payer: Self-pay | Admitting: *Deleted

## 2018-03-09 ENCOUNTER — Other Ambulatory Visit: Payer: Self-pay | Admitting: General Practice

## 2018-03-09 DIAGNOSIS — B351 Tinea unguium: Secondary | ICD-10-CM

## 2018-03-09 MED ORDER — QUINAPRIL HCL 40 MG PO TABS: 40.0000 mg | ORAL_TABLET | Freq: Every day | ORAL | 1 refills | Status: DC

## 2018-03-09 NOTE — Telephone Encounter (Signed)
Copied from [place]. Topic: Inquiry >> Mar 09, 2018  8:09 AM Pricilla Handler wrote: Reason for CRM: Patient called requesting an urgent refill of Quinapril (ACCUPRIL) 40 MG tablet. Patient states that he has been out for a week, and has none at this time. Patient stated that the medication was not sent to his mail order pharmacy. Patient would like a 7 day supply of quinapril (ACCUPRIL) 40 MG tablet sent to his local pharmacy: [facility] [place], [place] - [place] [phone] (Phone)     [phone] (Fax), and he wants the 90 day supply sent to his preferred mail order pharmacy asap. Patient's preferred mail order pharmacy is [place], [place] [phone] (Phone).       Thank You!!! [phone] (Fax)

## 2018-03-09 NOTE — Telephone Encounter (Signed)
Rx refilled per protocol- LOV: 12/30/17 with f/u 03/28/18

## 2018-03-10 ENCOUNTER — Other Ambulatory Visit: Payer: Self-pay | Admitting: General Practice

## 2018-03-10 MED ORDER — QUINAPRIL HCL 40 MG PO TABS: 40.0000 mg | ORAL_TABLET | Freq: Every day | ORAL | 1 refills | Status: DC

## 2018-03-12 NOTE — Assessment & Plan Note (Signed)
Deteriorated.  No evidence of bacterial infxn (including PNA on PE).  Encouraged him to continue his daily Zyrtec.  Add a daily nasal spray and will start a low dose prednisone taper to improve sinus inflammation.  Cautioned him that prednisone can cause sugars to rise and he needs to be careful of this.  Reviewed supportive care and red flags that should prompt return.  Pt expressed understanding and is in agreement w/ plan.

## 2018-03-23 DIAGNOSIS — N138 Other obstructive and reflux uropathy: Secondary | ICD-10-CM | POA: Diagnosis not present

## 2018-03-23 DIAGNOSIS — R358 Other polyuria: Secondary | ICD-10-CM | POA: Diagnosis not present

## 2018-03-23 DIAGNOSIS — N401 Enlarged prostate with lower urinary tract symptoms: Secondary | ICD-10-CM | POA: Diagnosis not present

## 2018-03-23 DIAGNOSIS — C679 Malignant neoplasm of bladder, unspecified: Secondary | ICD-10-CM | POA: Diagnosis not present

## 2018-03-28 ENCOUNTER — Other Ambulatory Visit: Payer: Self-pay

## 2018-03-28 ENCOUNTER — Encounter: Payer: Self-pay | Admitting: Family Medicine

## 2018-03-28 ENCOUNTER — Ambulatory Visit (INDEPENDENT_AMBULATORY_CARE_PROVIDER_SITE_OTHER): Payer: Medicare HMO | Admitting: Family Medicine

## 2018-03-28 VITALS — BP 128/81 | HR 76 | Temp 98.3°F | Resp 16 | Ht 70.0 in | Wt 273.5 lb

## 2018-03-28 DIAGNOSIS — R42 Dizziness and giddiness: Secondary | ICD-10-CM

## 2018-03-28 DIAGNOSIS — E1140 Type 2 diabetes mellitus with diabetic neuropathy, unspecified: Secondary | ICD-10-CM

## 2018-03-28 LAB — BASIC METABOLIC PANEL
BUN: 17 mg/dL (ref 6–23)
CO2: 29 mEq/L (ref 19–32)
Calcium: 9.2 mg/dL (ref 8.4–10.5)
Chloride: 101 mEq/L (ref 96–112)
Creatinine, Ser: 0.84 mg/dL (ref 0.40–1.50)
GFR: 95.59 mL/min (ref >60.00)
GLUCOSE: 156 mg/dL — AB (ref 70–99)
POTASSIUM: 3.8 meq/L (ref 3.5–5.1)
Sodium: 139 mEq/L (ref 135–145)

## 2018-03-28 LAB — HEMOGLOBIN A1C: Hgb A1c MFr Bld: 6.9 % — ABNORMAL HIGH (ref 4.6–6.5)

## 2018-03-28 MED ORDER — MECLIZINE HCL 25 MG PO TABS: 25.0000 mg | ORAL_TABLET | Freq: Three times a day (TID) | ORAL | 0 refills | Status: DC | PRN

## 2018-03-28 NOTE — Assessment & Plan Note (Signed)
Recurrent problem for pt.  He has not been keeping up w/ his fluids and not been using his meclizine.  Neuro exam WNL today.  Refilled meclizine and encouraged increased water intake.  If no improvement, will refer to Neuro for complete evaluation.  Pt expressed understanding and is in agreement w/ plan.

## 2018-03-28 NOTE — Patient Instructions (Signed)
Follow up in 3-4 months to recheck diabetes and cholesterol We'll notify you of your lab results and make any changes if needed Continue to work on healthy diet and regular exercise You are due for your eye exam in July- please schedule Use the Meclizine as needed for dizziness Increase your water intake If you continue to have the dizziness, please let me know so we can send you to neurology for evaluation Call with any questions or concerns Happy John Bonilla!!!

## 2018-03-28 NOTE — Progress Notes (Signed)
   Subjective:    Patient ID: John Bonilla, male    DOB: [birth year], 73 y.o.   MRN: [id number]  HPI DM- chronic problem, on Amaryl 1mg , Metformin 500mg  QID.  UTD on eye exam, foot exam (due in June), on ACE for renal protection.  Chronic neuropathy- [facility] just [facility] his gab[facility].  Denies symptomatic lows.  No CP, SOB, HAs, visual changes, abd pain, N/V.  Dizziness- pt reports recurrent sxs over the last week.  Pt reports as long as John Bonilla doesn't turn his head quickly, 'i'm ok'.  Decreased water intake recently.  Episodes will last 'maybe several minutes'.  Pt has not been using his Meclizine.  Review of Systems For ROS see HPI     Objective:   Physical Exam  Constitutional: John Bonilla is oriented to person, place, and time. John Bonilla appears well-developed and well-nourished. No distress.  obese  HENT:  Head: Normocephalic and atraumatic.  Eyes: Pupils are equal, round, and reactive to light. Conjunctivae and EOM are normal.  Neck: Normal range of motion. Neck supple. No thyromegaly present.  Cardiovascular: Normal rate, regular rhythm, normal heart sounds and intact distal pulses.  No murmur heard. Pulmonary/Chest: Effort normal and breath sounds normal. No respiratory distress.  Abdominal: Soft. Bowel sounds are normal. John Bonilla exhibits no distension.  Musculoskeletal: John Bonilla exhibits no edema.  Lymphadenopathy:    John Bonilla has no cervical adenopathy.  Neurological: John Bonilla is alert and oriented to person, place, and time. John Bonilla displays normal reflexes. No cranial nerve deficit. Coordination normal.  Skin: Skin is warm and dry.  Psychiatric: John Bonilla has a normal mood and affect. His behavior is normal.  Vitals reviewed.         Assessment & Plan:

## 2018-03-28 NOTE — Assessment & Plan Note (Signed)
Chronic problem.  Tolerating Metformin and Amarly w/o difficulty.  UTD on eye exam, foot exam done today.  On ACE for renal protection.  Stressed need for healthy diet and regular exercise.  Check labs.  Adjust meds prn

## 2018-03-29 ENCOUNTER — Encounter: Payer: Self-pay | Admitting: General Practice

## 2018-03-30 DIAGNOSIS — M1990 Unspecified osteoarthritis, unspecified site: Secondary | ICD-10-CM | POA: Diagnosis not present

## 2018-03-30 DIAGNOSIS — E1140 Type 2 diabetes mellitus with diabetic neuropathy, unspecified: Secondary | ICD-10-CM | POA: Diagnosis not present

## 2018-03-30 DIAGNOSIS — G4700 Insomnia, unspecified: Secondary | ICD-10-CM | POA: Diagnosis not present

## 2018-03-30 DIAGNOSIS — G8929 Other chronic pain: Secondary | ICD-10-CM | POA: Diagnosis not present

## 2018-03-30 DIAGNOSIS — M109 Gout, unspecified: Secondary | ICD-10-CM | POA: Diagnosis not present

## 2018-03-30 DIAGNOSIS — E785 Hyperlipidemia, unspecified: Secondary | ICD-10-CM | POA: Diagnosis not present

## 2018-03-30 DIAGNOSIS — I10 Essential (primary) hypertension: Secondary | ICD-10-CM | POA: Diagnosis not present

## 2018-03-30 DIAGNOSIS — I951 Orthostatic hypotension: Secondary | ICD-10-CM | POA: Diagnosis not present

## 2018-03-30 DIAGNOSIS — G4733 Obstructive sleep apnea (adult) (pediatric): Secondary | ICD-10-CM | POA: Diagnosis not present

## 2018-03-30 NOTE — Telephone Encounter (Signed)
Spoke with pt. He has medications. Started meclizine for dizziness. Will call next week if symptoms worsen or do not get better. Podiatry referral placed today.

## 2018-03-30 NOTE — Telephone Encounter (Signed)
Copied from [place] [id number]. Topic: General - Other >> Mar 30, 2018  9:44 AM Aurelio Brash B wrote: Reason for CRM: Pt wanted to let Dr Birdie Riddle know that he was seen by [facility] home health Dr   and he recommends  pt  be evaluated  for orthostatic hyper tension,  and    Referral  to podiatrist for  fungus in both feet because his diabetes

## 2018-03-30 NOTE — Telephone Encounter (Signed)
Quinapril was sent 3/29 90 and 1 refill to mail order and 3/28 #30 and 1 was sent to local pharmacy RN note says RX refilled per protocol but chart does not reflect this (which doesn't matter in this case b/c there are meds available to him) I am ok w/ referral to podiatry for nail fungus Will hold on evaluation for orthostatic hypertension as he has been out of his meds

## 2018-04-19 DIAGNOSIS — C44219 Basal cell carcinoma of skin of left ear and external auricular canal: Secondary | ICD-10-CM | POA: Diagnosis not present

## 2018-04-19 DIAGNOSIS — X32XXXD Exposure to sunlight, subsequent encounter: Secondary | ICD-10-CM | POA: Diagnosis not present

## 2018-04-19 DIAGNOSIS — B078 Other viral warts: Secondary | ICD-10-CM | POA: Diagnosis not present

## 2018-04-19 DIAGNOSIS — L570 Actinic keratosis: Secondary | ICD-10-CM | POA: Diagnosis not present

## 2018-04-26 ENCOUNTER — Ambulatory Visit: Payer: Medicare HMO | Admitting: Podiatry

## 2018-04-26 ENCOUNTER — Encounter: Payer: Self-pay | Admitting: Podiatry

## 2018-04-26 VITALS — BP 144/84 | HR 88

## 2018-04-26 DIAGNOSIS — E1142 Type 2 diabetes mellitus with diabetic polyneuropathy: Secondary | ICD-10-CM

## 2018-04-26 DIAGNOSIS — B351 Tinea unguium: Secondary | ICD-10-CM | POA: Diagnosis not present

## 2018-04-26 DIAGNOSIS — M79674 Pain in right toe(s): Secondary | ICD-10-CM

## 2018-04-26 DIAGNOSIS — M79675 Pain in left toe(s): Secondary | ICD-10-CM

## 2018-04-26 NOTE — Progress Notes (Addendum)
This patient presents to the office with chief complaint of long thick nails and diabetic feet.  This patient  says there  is  no pain and discomfort in his  feet.  This patient says there are long thick painful nails.  These nails are painful walking and wearing shoes.  Patient has no history of infection or drainage from both feet.  Patient is unable to  self treat his own nails . Patient says that his medical doctor was very concerned about the status of his thick nails and recommended he be evaluated by a podiatrist. This patient presents  to the office today for treatment of the  long nails and a foot evaluation due to history of  diabetes.  General Appearance  Alert, conversant and in no acute stress.  Vascular  Dorsalis pedis and posterior tibial  pulses are palpable  bilaterally.  Capillary return is within normal limits  bilaterally. Temperature is within normal limits  bilaterally.  Neurologic  Senn-Weinstein monofilament wire test diminished   bilaterally. Muscle power within normal limits bilaterally.  Nails Thick disfigured discolored nails with subungual debris  from hallux to fifth toes left foot.  Onychomycosis hallux right foot. No evidence of bacterial infection or drainage bilaterally.  Orthopedic  No limitations of motion of motion feet .  No crepitus or effusions noted.  No bony pathology or digital deformities noted.  Skin  normotropic skin with no porokeratosis noted bilaterally.  No signs of infections or ulcers noted.     Onychomycosis  Diabetes with no foot complications  IE  Debride nails x 10.  A diabetic foot exam was performed and there is no evidence of any vascular or neurologic pathology.   RTC 3 months. Told this patient that he does have a fungus toenails, but that it is not a serious condition.  It does not become a systemic problem.  Patient was told to return to the office for his annual diabetic foot exam.   Gardiner Barefoot DPM

## 2018-05-17 ENCOUNTER — Ambulatory Visit: Payer: Self-pay

## 2018-05-17 DIAGNOSIS — L821 Other seborrheic keratosis: Secondary | ICD-10-CM | POA: Diagnosis not present

## 2018-05-17 DIAGNOSIS — Z08 Encounter for follow-up examination after completed treatment for malignant neoplasm: Secondary | ICD-10-CM | POA: Diagnosis not present

## 2018-05-17 DIAGNOSIS — Z85828 Personal history of other malignant neoplasm of skin: Secondary | ICD-10-CM | POA: Diagnosis not present

## 2018-05-17 NOTE — Telephone Encounter (Signed)
Phone call from pt. to report intermittent episodes of feeling light headed, more fatigued and generally more weak over past 7 days.  Reported episode last Wed. or Thurs. that he felt was more severe than ever experienced.  Reported he was shopping with wife, and started having onset of weakness that progressed to being light headed, and "perimeter of vision got dark".  Stated he was able to still see in front of himself, and quickly found a place to sit down.   Stated the episode may have lasted about 30 minutes, but the weakness lasted about an hour.  Denied any unilateral weakness of extremities; stated weakness is more general.  Denied any confusion surrounding the episode.  Also reported he has had dizziness in the past, that would typically last about a few seconds, but this feels different than those previous episodes.  Denied experiencing any loss of consciousness, but stated he knew he needed to sit down before he fell down.  Also, c/o intermittent pain in both legs.  Unsure if there is a correlation of the leg pain with the dizziness.  Wife checked radial pulse at this time; 60 bpm and regular.  Stated recent BP was 124/80, yesterday.  Has not checked BP today.  Pt. Stated he is more tired than usual, even though he feels he is getting enough sleep. Questioned about dizziness at present time; stated he feels that he wouldn't want to move quickly.  Appt. Sched. For 6/6 with PCP.  Care Advice given per protocol; verb. Understanding; agrees with plan.       Reason for Disposition . [1] MODERATE dizziness (e.g., interferes with normal activities) AND [2] has NOT been evaluated by physician for this  (Exception: dizziness caused by heat exposure, sudden standing, or poor fluid intake)  Answer Assessment - Initial Assessment Questions 1. DESCRIPTION: "Describe your dizziness."     Onset of feeling weak and tired, then perimeter of vision gets darker; can still see in front of him 2. LIGHTHEADED: "Do you  feel lightheaded?" (e.g., somewhat faint, woozy, weak upon standing)     Occasional episode of feeling near faint 3. VERTIGO: "Do you feel like either you or the room is spinning or tilting?" (i.e. vertigo)     denied 4. SEVERITY: "How bad is it?"  "Do you feel like you are going to faint?" "Can you stand and walk?"   - MILD - walking normally   - MODERATE - interferes with normal activities (e.g., work, school)    - SEVERE - unable to stand, requires support to walk, feels like passing out now.      Severe episode last week, while in a store.    5. ONSET:  "When did the dizziness begin?"     Intermittent dizziness for awhile; was only for a few seconds, now longer and different 6. AGGRAVATING FACTORS: "Does anything make it worse?" (e.g., standing, change in head position)     Bending and standing back up 7. HEART RATE: "Can you tell me your heart rate?" "How many beats in 15 seconds?"  (Note: not all patients can do this)       Pulse rate 60 BPM/ regular at present 8. CAUSE: "What do you think is causing the dizziness?"     unknown 9. RECURRENT SYMPTOM: "Have you had dizziness before?" If so, ask: "When was the last time?" "What happened that time?"     Yes; PCP was aware.  10. OTHER SYMPTOMS: "Do you have any other symptoms?" (  e.g., fever, chest pain, vomiting, diarrhea, bleeding)       Feeling of weakness, some hand shaking of left hand; some discomfort in legs/feet; denied confusion  Protocols used: DIZZINESS Heidi Dach

## 2018-05-18 ENCOUNTER — Encounter: Payer: Self-pay | Admitting: Family Medicine

## 2018-05-18 ENCOUNTER — Ambulatory Visit (INDEPENDENT_AMBULATORY_CARE_PROVIDER_SITE_OTHER): Payer: Medicare HMO | Admitting: Family Medicine

## 2018-05-18 ENCOUNTER — Other Ambulatory Visit: Payer: Self-pay

## 2018-05-18 VITALS — BP 140/81 | HR 73 | Temp 98.1°F | Resp 16 | Ht 70.0 in | Wt 276.1 lb

## 2018-05-18 DIAGNOSIS — H814 Vertigo of central origin: Secondary | ICD-10-CM

## 2018-05-18 DIAGNOSIS — R42 Dizziness and giddiness: Secondary | ICD-10-CM | POA: Diagnosis not present

## 2018-05-18 DIAGNOSIS — H8149 Vertigo of central origin, unspecified ear: Secondary | ICD-10-CM

## 2018-05-18 LAB — BASIC METABOLIC PANEL
BUN: 21 mg/dL (ref 6–23)
CALCIUM: 9.7 mg/dL (ref 8.4–10.5)
CO2: 29 mEq/L (ref 19–32)
Chloride: 102 mEq/L (ref 96–112)
Creatinine, Ser: 0.8 mg/dL (ref 0.40–1.50)
GFR: 101.08 mL/min (ref >60.00)
Glucose, Bld: 151 mg/dL — ABNORMAL HIGH (ref 70–99)
POTASSIUM: 4.1 meq/L (ref 3.5–5.1)
SODIUM: 139 meq/L (ref 135–145)

## 2018-05-18 LAB — CBC WITH DIFFERENTIAL/PLATELET
BASOS ABS: 0.1 10*3/uL (ref 0.0–0.1)
Basophils Relative: 1 % (ref 0.0–3.0)
Eosinophils Absolute: 0.3 10*3/uL (ref 0.0–0.7)
Eosinophils Relative: 5.4 % — ABNORMAL HIGH (ref 0.0–5.0)
HEMATOCRIT: 42.4 % (ref 39.0–52.0)
HEMOGLOBIN: 14.4 g/dL (ref 13.0–17.0)
LYMPHS PCT: 21.5 % (ref 12.0–46.0)
Lymphs Abs: 1.2 10*3/uL (ref 0.7–4.0)
MCHC: 34 g/dL (ref 30.0–36.0)
MCV: 104.1 fl — AB (ref 78.0–100.0)
MONOS PCT: 9.6 % (ref 3.0–12.0)
Monocytes Absolute: 0.6 10*3/uL (ref 0.1–1.0)
Neutro Abs: 3.6 10*3/uL (ref 1.4–7.7)
Neutrophils Relative %: 62.5 % (ref 43.0–77.0)
PLATELETS: 221 10*3/uL (ref 150.0–400.0)
RBC: 4.08 Mil/uL — AB (ref 4.22–5.81)
RDW: 14.5 % (ref 11.5–15.5)
WBC: 5.8 10*3/uL (ref 4.0–10.5)

## 2018-05-18 LAB — TSH: TSH: 1.39 u[IU]/mL (ref 0.35–4.50)

## 2018-05-18 NOTE — Progress Notes (Signed)
   Subjective:    Patient ID: John Bonilla, male    DOB: 02-25-1946, 72 y.o.   MRN: [id number]  HPI Dizziness- pt reports feeling poorly x2 weeks but has had '2 serious episodes' within the last week.  First episode occurred while shopping and peripheral vision went dark, 'tunnel vision', and he felt 'very weak'.  He was able to sit down.  Took quite awhile for weakness to resolve but vision cleared quickly.  2nd episode occurred after trimming bushes for 30 minutes and he again felt very weak.  He was unable to hold a water bottle w/ either hand due to shaking and weakness.  'a typical day includes a lot of lightheadedness'- regardless of sitting/standing.  Sleeping well.  Pt reports he is eating regularly- usually 3 meals/day.  Pt reports his sxs are 'pretty consistent throughout the day'.  Pt denies room spinning but will at times feel that he is spinning.  Taking Meclizine w/o relief.   Review of Systems For ROS see HPI     Objective:   Physical Exam  Constitutional: He is oriented to person, place, and time. He appears well-developed and well-nourished. No distress.  HENT:  Head: Normocephalic and atraumatic.  Eyes: Pupils are equal, round, and reactive to light. Conjunctivae and EOM are normal.  Neck: Normal range of motion. Neck supple. No thyromegaly present.  Cardiovascular: Normal rate, regular rhythm, normal heart sounds and intact distal pulses.  No murmur heard. Pulmonary/Chest: Effort normal and breath sounds normal. No respiratory distress.  Abdominal: Soft. Bowel sounds are normal. He exhibits no distension.  Musculoskeletal: He exhibits no edema.  Lymphadenopathy:    He has no cervical adenopathy.  Neurological: He is alert and oriented to person, place, and time. He displays normal reflexes. No cranial nerve deficit. He exhibits normal muscle tone. Coordination normal.  No tremor noted on exam Normal gait  Skin: Skin is warm and dry.  Psychiatric: He has a normal  mood and affect. His behavior is normal.  Vitals reviewed.         Assessment & Plan:

## 2018-05-18 NOTE — Patient Instructions (Signed)
We'll notify you of your lab results and your imaging results Drink plenty of fluids Change positions slowly! Continue the Meclizine to prevent dizziness If your symptoms change or worsen- please go to the ER Hang in there!!!

## 2018-05-19 NOTE — Assessment & Plan Note (Signed)
Deteriorated.  Pt has had 2 severe episodes where he had visual changes, weakness, and shaking.  No definitive syncope.  No seizure activity.  Dizziness is now constant regardless of time of day.  Eating regularly.  Denies room spinning but at times will feel that he is.  PE WNL.  Concern for decreased carotid blood flow given hx of diabetes.  Will also get MRI to assess for TIA.  Check labs to assess for metabolic cause of dizziness.  Reviewed supportive care and red flags that should prompt return.  Pt expressed understanding and is in agreement w/ plan.

## 2018-05-20 ENCOUNTER — Ambulatory Visit (HOSPITAL_BASED_OUTPATIENT_CLINIC_OR_DEPARTMENT_OTHER)
Admission: RE | Admit: 2018-05-20 | Discharge: 2018-05-20 | Disposition: A | Discharge status: Home / Self Care | Payer: Medicare HMO | Source: Ambulatory Visit | Attending: Family Medicine | Admitting: Family Medicine

## 2018-05-20 DIAGNOSIS — H814 Vertigo of central origin: Secondary | ICD-10-CM

## 2018-05-20 DIAGNOSIS — H8149 Vertigo of central origin, unspecified ear: Secondary | ICD-10-CM | POA: Diagnosis not present

## 2018-05-20 DIAGNOSIS — R42 Dizziness and giddiness: Secondary | ICD-10-CM

## 2018-05-20 MED ORDER — GADOBENATE DIMEGLUMINE 529 MG/ML IV SOLN
20.0000 mL | Freq: Once | INTRAVENOUS | Status: AC | PRN
  Administered 2018-05-20: 20 mL via INTRAVENOUS

## 2018-05-22 ENCOUNTER — Other Ambulatory Visit: Payer: Self-pay

## 2018-05-22 DIAGNOSIS — Z79899 Other long term (current) drug therapy: Secondary | ICD-10-CM | POA: Diagnosis not present

## 2018-05-22 DIAGNOSIS — E669 Obesity, unspecified: Secondary | ICD-10-CM | POA: Diagnosis not present

## 2018-05-22 DIAGNOSIS — M1009 Idiopathic gout, multiple sites: Secondary | ICD-10-CM | POA: Diagnosis not present

## 2018-05-22 DIAGNOSIS — M150 Primary generalized (osteo)arthritis: Secondary | ICD-10-CM | POA: Diagnosis not present

## 2018-05-22 DIAGNOSIS — M2550 Pain in unspecified joint: Secondary | ICD-10-CM | POA: Diagnosis not present

## 2018-05-22 DIAGNOSIS — Z6839 Body mass index (BMI) 39.0-39.9, adult: Secondary | ICD-10-CM | POA: Diagnosis not present

## 2018-05-22 DIAGNOSIS — M4802 Spinal stenosis, cervical region: Secondary | ICD-10-CM | POA: Diagnosis not present

## 2018-05-22 MED ORDER — MECLIZINE HCL 25 MG PO TABS: 25.0000 mg | ORAL_TABLET | Freq: Three times a day (TID) | ORAL | 0 refills | Status: DC | PRN

## 2018-05-23 ENCOUNTER — Ambulatory Visit (HOSPITAL_BASED_OUTPATIENT_CLINIC_OR_DEPARTMENT_OTHER)
Admission: RE | Admit: 2018-05-23 | Discharge: 2018-05-23 | Disposition: A | Discharge status: Home / Self Care | Payer: Medicare HMO | Source: Ambulatory Visit | Attending: Family Medicine | Admitting: Family Medicine

## 2018-05-23 DIAGNOSIS — I6523 Occlusion and stenosis of bilateral carotid arteries: Secondary | ICD-10-CM | POA: Diagnosis not present

## 2018-05-23 DIAGNOSIS — R42 Dizziness and giddiness: Secondary | ICD-10-CM | POA: Diagnosis present

## 2018-05-26 ENCOUNTER — Telehealth: Payer: Self-pay | Admitting: Family Medicine

## 2018-05-26 NOTE — Telephone Encounter (Signed)
Noted. Will see pt on monday

## 2018-05-26 NOTE — Telephone Encounter (Signed)
Pt returned call to evaluate how he is feeling. He states that he is still having some on going dizziness but not severe enough to go to the ED. So appointment has been scheduled for this Monday. He also stated his wife is having hip surgery on Tuesday.

## 2018-05-29 ENCOUNTER — Encounter: Payer: Self-pay | Admitting: Family Medicine

## 2018-05-29 ENCOUNTER — Other Ambulatory Visit: Payer: Self-pay

## 2018-05-29 ENCOUNTER — Ambulatory Visit (INDEPENDENT_AMBULATORY_CARE_PROVIDER_SITE_OTHER): Payer: Medicare HMO | Admitting: Family Medicine

## 2018-05-29 VITALS — BP 132/82 | HR 65 | Temp 98.0°F | Resp 16 | Ht 70.0 in | Wt 276.5 lb

## 2018-05-29 DIAGNOSIS — R42 Dizziness and giddiness: Secondary | ICD-10-CM

## 2018-05-29 NOTE — Patient Instructions (Signed)
Follow up as needed or as scheduled We'll call you with your Neuro appt to evaluate the dizziness Continue to drink plenty of fluids Change positions slowly!!! Call with any questions or concerns GOOD LUCK TOMORROW!!!

## 2018-05-29 NOTE — Assessment & Plan Note (Signed)
Ongoing issue for pt.  He has not had any recent severe episodes.  Carotid dopplers, EKG, MRI, and labs all WNL and not revealing of underlying cause.  Pt feels some of this is age and stress related.  Will refer to Neuro for complete evaluation as cursory neuro exam in office is WNL.  Reviewed supportive care and red flags that should prompt return.  Pt expressed understanding and is in agreement w/ plan.

## 2018-05-29 NOTE — Progress Notes (Signed)
   Subjective:    Patient ID: John Bonilla, male    DOB: 1945/12/19, 72 y.o.   MRN: [id number]  HPI Dizziness- pt was seen on 6/6 for the same sxs.  Pt reports no improvement since last visit.  EKG, MRI, and carotid dopplers were all normal.  Pt reports his 'lightheadedness and not feeling right happens most often when changing positions'.  Pt has not had anything similar to the 2 'severe' episodes that he had prior to last visit.  Doesn't feel like he's going to pass out but does feel that 'there's an increased fall risk'.  Pt has been limiting his activities due to fear of dizziness/falling.  No improvement w/ meclizine.  Wife has hip replacement scheduled for tomorrow and he wonders how much of this is anxiety driven.   Review of Systems For ROS see HPI     Objective:   Physical Exam  Constitutional: He is oriented to person, place, and time. He appears well-developed and well-nourished. No distress.  HENT:  Head: Normocephalic and atraumatic.  Right Ear: External ear normal.  Left Ear: External ear normal.  Eyes: Pupils are equal, round, and reactive to light. Conjunctivae and EOM are normal.  Neck: Normal range of motion. Neck supple.  Neurological: He is alert and oriented to person, place, and time. He displays normal reflexes. No cranial nerve deficit. He exhibits normal muscle tone. Coordination (rapid alternating movements intact) normal.  Able to rise from chair and walk down hall w/o difficulty  Skin: Skin is warm and dry.  Psychiatric: He has a normal mood and affect. His behavior is normal. Thought content normal.  Vitals reviewed.         Assessment & Plan:

## 2018-06-17 DIAGNOSIS — R69 Illness, unspecified: Secondary | ICD-10-CM | POA: Diagnosis not present

## 2018-06-18 ENCOUNTER — Other Ambulatory Visit: Payer: Self-pay | Admitting: Family Medicine

## 2018-06-28 ENCOUNTER — Ambulatory Visit (INDEPENDENT_AMBULATORY_CARE_PROVIDER_SITE_OTHER): Payer: Medicare HMO | Admitting: Family Medicine

## 2018-06-28 ENCOUNTER — Other Ambulatory Visit: Payer: Self-pay

## 2018-06-28 ENCOUNTER — Encounter: Payer: Self-pay | Admitting: Family Medicine

## 2018-06-28 ENCOUNTER — Other Ambulatory Visit: Payer: Self-pay | Admitting: Family Medicine

## 2018-06-28 VITALS — BP 130/81 | HR 60 | Temp 98.1°F | Resp 16 | Ht 70.0 in | Wt 274.4 lb

## 2018-06-28 DIAGNOSIS — I10 Essential (primary) hypertension: Secondary | ICD-10-CM | POA: Diagnosis not present

## 2018-06-28 DIAGNOSIS — E1140 Type 2 diabetes mellitus with diabetic neuropathy, unspecified: Secondary | ICD-10-CM | POA: Diagnosis not present

## 2018-06-28 DIAGNOSIS — E1169 Type 2 diabetes mellitus with other specified complication: Secondary | ICD-10-CM

## 2018-06-28 DIAGNOSIS — E785 Hyperlipidemia, unspecified: Secondary | ICD-10-CM | POA: Diagnosis not present

## 2018-06-28 LAB — CBC WITH DIFFERENTIAL/PLATELET
BASOS PCT: 1.2 % (ref 0.0–3.0)
Basophils Absolute: 0.1 10*3/uL (ref 0.0–0.1)
Eosinophils Absolute: 0.3 10*3/uL (ref 0.0–0.7)
Eosinophils Relative: 5.1 % — ABNORMAL HIGH (ref 0.0–5.0)
HCT: 43.9 % (ref 39.0–52.0)
Hemoglobin: 14.5 g/dL (ref 13.0–17.0)
Lymphocytes Relative: 20.8 % (ref 12.0–46.0)
Lymphs Abs: 1.2 10*3/uL (ref 0.7–4.0)
MCHC: 33.1 g/dL (ref 30.0–36.0)
MCV: 105.7 fl — ABNORMAL HIGH (ref 78.0–100.0)
MONO ABS: 0.6 10*3/uL (ref 0.1–1.0)
Monocytes Relative: 10.8 % (ref 3.0–12.0)
NEUTROS ABS: 3.7 10*3/uL (ref 1.4–7.7)
Neutrophils Relative %: 62.1 % (ref 43.0–77.0)
Platelets: 244 10*3/uL (ref 150.0–400.0)
RBC: 4.16 Mil/uL — ABNORMAL LOW (ref 4.22–5.81)
RDW: 14.8 % (ref 11.5–15.5)
WBC: 6 10*3/uL (ref 4.0–10.5)

## 2018-06-28 LAB — BASIC METABOLIC PANEL
BUN: 21 mg/dL (ref 6–23)
CHLORIDE: 102 meq/L (ref 96–112)
CO2: 30 meq/L (ref 19–32)
CREATININE: 1.05 mg/dL (ref 0.40–1.50)
Calcium: 9.5 mg/dL (ref 8.4–10.5)
GFR: 73.83 mL/min (ref >60.00)
Glucose, Bld: 107 mg/dL — ABNORMAL HIGH (ref 70–99)
Potassium: 4.1 mEq/L (ref 3.5–5.1)
Sodium: 141 mEq/L (ref 135–145)

## 2018-06-28 LAB — HEPATIC FUNCTION PANEL
ALT: 19 U/L (ref 0–53)
AST: 18 U/L (ref 0–37)
Albumin: 4.4 g/dL (ref 3.5–5.2)
Alkaline Phosphatase: 47 U/L (ref 39–117)
BILIRUBIN DIRECT: 0.2 mg/dL (ref 0.0–0.3)
BILIRUBIN TOTAL: 0.6 mg/dL (ref 0.2–1.2)
Total Protein: 6.7 g/dL (ref 6.0–8.3)

## 2018-06-28 LAB — HEMOGLOBIN A1C: HEMOGLOBIN A1C: 6.2 % (ref 4.6–6.5)

## 2018-06-28 LAB — TSH: TSH: 3.15 u[IU]/mL (ref 0.35–4.50)

## 2018-06-28 LAB — LIPID PANEL
CHOLESTEROL: 153 mg/dL (ref 0–200)
HDL: 50.1 mg/dL (ref >39.00)
LDL Cholesterol: 75 mg/dL (ref 0–99)
NONHDL: 103.1
Total CHOL/HDL Ratio: 3
Triglycerides: 141 mg/dL (ref 0.0–149.0)
VLDL: 28.2 mg/dL (ref 0.0–40.0)

## 2018-06-28 NOTE — Assessment & Plan Note (Signed)
Chronic problem.  Tolerating statin w/o difficulty.  Encouraged healthy diet and regular exercise.  Check labs.  Adjust meds prn. 

## 2018-06-28 NOTE — Assessment & Plan Note (Signed)
Chronic problem, adequate control today.  Asymptomatic w/ exception of continued intermittent dizziness.  Upcoming appt w/ Neurology.  Check labs.  No anticipated med changes.  Will follow.

## 2018-06-28 NOTE — Assessment & Plan Note (Signed)
Chronic problem.  Hx of good control.  UTD on foot exam, eye exam.  On ACE for renal protection.  Assessed darkened toenail- no cause for concern at this time.  Check labs.  Adjust meds prn

## 2018-06-28 NOTE — Progress Notes (Signed)
   Subjective:    Patient ID: John Bonilla, male    DOB: 1946/02/23, 72 y.o.   MRN: [id number]  HPI DM- chronic problem, on Amaryl 1mg  daily and Metformin 500mg  QID w/ hx of good control.  UTD on foot exam, eye exam.  On ACE for renal protection.  Denies symptomatic lows.  + chronic neuropathy.  Pt has seen podiatry recently.  R 2nd toenail is darker than others.  No known injury.  No pain.  Home CBGs 130s.  Hyperlipidemia- chronic problem, on Pravastatin 40mg  daily w/ hx of good control.  No abd pain, N/V.  HTN- chronic problem, on HCTZ 12.5mg  daily, quinapril 40mg  daily w/ good control.  No CP, SOB above baseline, HAs, visual changes.  Continues to have dizziness   Review of Systems For ROS see HPI     Objective:   Physical Exam  Constitutional: He is oriented to person, place, and time. He appears well-developed and well-nourished. No distress.  obese  HENT:  Head: Normocephalic and atraumatic.  Eyes: Pupils are equal, round, and reactive to light. Conjunctivae and EOM are normal.  Neck: Normal range of motion. Neck supple. No thyromegaly present.  Cardiovascular: Normal rate, regular rhythm, normal heart sounds and intact distal pulses.  No murmur heard. Pulmonary/Chest: Effort normal and breath sounds normal. No respiratory distress.  Abdominal: Soft. Bowel sounds are normal. He exhibits no distension.  Musculoskeletal: He exhibits no edema.  Lymphadenopathy:    He has no cervical adenopathy.  Neurological: He is alert and oriented to person, place, and time. No cranial nerve deficit.  Skin: Skin is warm and dry.  Hyperpigmentation of R 2nd toe- appears to be a bruise/previous nail damage  Psychiatric: He has a normal mood and affect. His behavior is normal.  Vitals reviewed.         Assessment & Plan:

## 2018-06-28 NOTE — Patient Instructions (Addendum)
Follow up in 3-4 months to recheck sugars We'll notify you of your lab results and make any changes if needed Continue to work on healthy diet and regular exercise- you are down 3 lbs!!! The toenail appears to be bruised or from some nailbed trauma- no cause for concern at this time If your knees do not feel better in the next few weeks- let me know!  This is most likely an arthritis flare from all the walking Call with any questions or concerns Enjoy the rest of your summer!!!

## 2018-06-29 ENCOUNTER — Encounter: Payer: Self-pay | Admitting: General Practice

## 2018-07-07 ENCOUNTER — Ambulatory Visit: Payer: Self-pay | Admitting: *Deleted

## 2018-07-07 ENCOUNTER — Emergency Department (HOSPITAL_COMMUNITY): Payer: Medicare HMO

## 2018-07-07 ENCOUNTER — Other Ambulatory Visit: Payer: Self-pay

## 2018-07-07 ENCOUNTER — Inpatient Hospital Stay (HOSPITAL_COMMUNITY)
Admission: EM | Admit: 2018-07-07 | Discharge: 2018-07-09 | DRG: 149 | Disposition: A | Discharge status: Home / Self Care | Payer: Medicare HMO | Attending: Family Medicine | Admitting: Family Medicine

## 2018-07-07 ENCOUNTER — Encounter (HOSPITAL_COMMUNITY): Payer: Self-pay | Admitting: Emergency Medicine

## 2018-07-07 DIAGNOSIS — I5030 Unspecified diastolic (congestive) heart failure: Secondary | ICD-10-CM | POA: Diagnosis not present

## 2018-07-07 DIAGNOSIS — Z809 Family history of malignant neoplasm, unspecified: Secondary | ICD-10-CM

## 2018-07-07 DIAGNOSIS — T426X5A Adverse effect of other antiepileptic and sedative-hypnotic drugs, initial encounter: Secondary | ICD-10-CM | POA: Diagnosis present

## 2018-07-07 DIAGNOSIS — Z7951 Long term (current) use of inhaled steroids: Secondary | ICD-10-CM | POA: Diagnosis not present

## 2018-07-07 DIAGNOSIS — Z79899 Other long term (current) drug therapy: Secondary | ICD-10-CM | POA: Diagnosis not present

## 2018-07-07 DIAGNOSIS — Z8551 Personal history of malignant neoplasm of bladder: Secondary | ICD-10-CM

## 2018-07-07 DIAGNOSIS — Z87891 Personal history of nicotine dependence: Secondary | ICD-10-CM | POA: Diagnosis not present

## 2018-07-07 DIAGNOSIS — R42 Dizziness and giddiness: Secondary | ICD-10-CM | POA: Diagnosis not present

## 2018-07-07 DIAGNOSIS — M549 Dorsalgia, unspecified: Secondary | ICD-10-CM | POA: Diagnosis present

## 2018-07-07 DIAGNOSIS — Z8249 Family history of ischemic heart disease and other diseases of the circulatory system: Secondary | ICD-10-CM | POA: Diagnosis not present

## 2018-07-07 DIAGNOSIS — E1140 Type 2 diabetes mellitus with diabetic neuropathy, unspecified: Secondary | ICD-10-CM | POA: Diagnosis present

## 2018-07-07 DIAGNOSIS — R55 Syncope and collapse: Secondary | ICD-10-CM

## 2018-07-07 DIAGNOSIS — K219 Gastro-esophageal reflux disease without esophagitis: Secondary | ICD-10-CM | POA: Diagnosis present

## 2018-07-07 DIAGNOSIS — M109 Gout, unspecified: Secondary | ICD-10-CM | POA: Diagnosis present

## 2018-07-07 DIAGNOSIS — D7589 Other specified diseases of blood and blood-forming organs: Secondary | ICD-10-CM | POA: Diagnosis present

## 2018-07-07 DIAGNOSIS — Z7982 Long term (current) use of aspirin: Secondary | ICD-10-CM | POA: Diagnosis not present

## 2018-07-07 DIAGNOSIS — I110 Hypertensive heart disease with heart failure: Secondary | ICD-10-CM | POA: Diagnosis present

## 2018-07-07 DIAGNOSIS — I10 Essential (primary) hypertension: Secondary | ICD-10-CM | POA: Diagnosis present

## 2018-07-07 DIAGNOSIS — Z66 Do not resuscitate: Secondary | ICD-10-CM | POA: Diagnosis present

## 2018-07-07 DIAGNOSIS — E538 Deficiency of other specified B group vitamins: Secondary | ICD-10-CM | POA: Diagnosis not present

## 2018-07-07 DIAGNOSIS — Z7984 Long term (current) use of oral hypoglycemic drugs: Secondary | ICD-10-CM

## 2018-07-07 DIAGNOSIS — G4733 Obstructive sleep apnea (adult) (pediatric): Secondary | ICD-10-CM | POA: Diagnosis not present

## 2018-07-07 DIAGNOSIS — R5383 Other fatigue: Secondary | ICD-10-CM | POA: Diagnosis not present

## 2018-07-07 DIAGNOSIS — E785 Hyperlipidemia, unspecified: Secondary | ICD-10-CM | POA: Diagnosis not present

## 2018-07-07 DIAGNOSIS — R002 Palpitations: Secondary | ICD-10-CM

## 2018-07-07 DIAGNOSIS — R269 Unspecified abnormalities of gait and mobility: Secondary | ICD-10-CM | POA: Diagnosis not present

## 2018-07-07 DIAGNOSIS — M1A9XX0 Chronic gout, unspecified, without tophus (tophi): Secondary | ICD-10-CM | POA: Diagnosis not present

## 2018-07-07 LAB — RETICULOCYTES
RBC.: 4.08 MIL/uL — ABNORMAL LOW (ref 4.22–5.81)
RETIC COUNT ABSOLUTE: 53 10*3/uL (ref 19.0–186.0)
Retic Ct Pct: 1.3 % (ref 0.4–3.1)

## 2018-07-07 LAB — TSH: TSH: 2.151 u[IU]/mL (ref 0.350–4.500)

## 2018-07-07 LAB — TROPONIN I: Troponin I: <0.03 ng/mL (ref <0.03)

## 2018-07-07 LAB — CBC
HCT: 44 % (ref 39.0–52.0)
HCT: 42.8 % (ref 39.0–52.0)
Hemoglobin: 14.5 g/dL (ref 13.0–17.0)
Hemoglobin: 14.3 g/dL (ref 13.0–17.0)
MCH: 34.8 pg — ABNORMAL HIGH (ref 26.0–34.0)
MCH: 35 pg — ABNORMAL HIGH (ref 26.0–34.0)
MCHC: 33 g/dL (ref 30.0–36.0)
MCHC: 33.4 g/dL (ref 30.0–36.0)
MCV: 105.5 fL — AB (ref 78.0–100.0)
MCV: 104.9 fL — ABNORMAL HIGH (ref 78.0–100.0)
PLATELETS: 223 10*3/uL (ref 150–400)
Platelets: 234 10*3/uL (ref 150–400)
RBC: 4.17 MIL/uL — ABNORMAL LOW (ref 4.22–5.81)
RBC: 4.08 MIL/uL — ABNORMAL LOW (ref 4.22–5.81)
RDW: 13.4 % (ref 11.5–15.5)
RDW: 13.2 % (ref 11.5–15.5)
WBC: 7.2 10*3/uL (ref 4.0–10.5)
WBC: 6.2 10*3/uL (ref 4.0–10.5)

## 2018-07-07 LAB — MAGNESIUM: Magnesium: 2 mg/dL (ref 1.7–2.4)

## 2018-07-07 LAB — CBG MONITORING, ED: Glucose-Capillary: 110 mg/dL — ABNORMAL HIGH (ref 70–99)

## 2018-07-07 LAB — HEPATIC FUNCTION PANEL
ALBUMIN: 3.9 g/dL (ref 3.5–5.0)
ALT: 24 U/L (ref 0–44)
AST: 27 U/L (ref 15–41)
Alkaline Phosphatase: 47 U/L (ref 38–126)
BILIRUBIN INDIRECT: 0.7 mg/dL (ref 0.3–0.9)
Bilirubin, Direct: 0.2 mg/dL (ref 0.0–0.2)
TOTAL PROTEIN: 6.6 g/dL (ref 6.5–8.1)
Total Bilirubin: 0.9 mg/dL (ref 0.3–1.2)

## 2018-07-07 LAB — BASIC METABOLIC PANEL
Anion gap: 13 (ref 5–15)
BUN: 23 mg/dL (ref 8–23)
CHLORIDE: 102 mmol/L (ref 98–111)
CO2: 24 mmol/L (ref 22–32)
Calcium: 9.2 mg/dL (ref 8.9–10.3)
Creatinine, Ser: 1.09 mg/dL (ref 0.61–1.24)
GFR calc Af Amer: >60 mL/min (ref >60)
GFR calc non Af Amer: >60 mL/min (ref >60)
Glucose, Bld: 174 mg/dL — ABNORMAL HIGH (ref 70–99)
Potassium: 4.3 mmol/L (ref 3.5–5.1)
SODIUM: 139 mmol/L (ref 135–145)

## 2018-07-07 LAB — CREATININE, SERUM
CREATININE: 1.05 mg/dL (ref 0.61–1.24)
GFR calc non Af Amer: >60 mL/min (ref >60)

## 2018-07-07 LAB — CK: Total CK: 144 U/L (ref 49–397)

## 2018-07-07 LAB — BRAIN NATRIURETIC PEPTIDE: B Natriuretic Peptide: 11.9 pg/mL (ref 0.0–100.0)

## 2018-07-07 LAB — VITAMIN B12: VITAMIN B 12: 172 pg/mL — AB (ref 180–914)

## 2018-07-07 MED ORDER — ENOXAPARIN SODIUM 60 MG/0.6ML ~~LOC~~ SOLN
60.0000 mg | SUBCUTANEOUS | Status: DC
  Administered 2018-07-07 – 2018-07-08 (×2): 60 mg via SUBCUTANEOUS
  Filled 2018-07-07 (×2): qty 0.6

## 2018-07-07 MED ORDER — GABAPENTIN 300 MG PO CAPS
300.0000 mg | ORAL_CAPSULE | Freq: Two times a day (BID) | ORAL | Status: DC
  Administered 2018-07-07 – 2018-07-09 (×4): 300 mg via ORAL
  Filled 2018-07-07 (×4): qty 1

## 2018-07-07 MED ORDER — SODIUM CHLORIDE 0.9% FLUSH: 3.0000 mL | INTRAVENOUS | Status: DC | PRN

## 2018-07-07 MED ORDER — METFORMIN HCL 500 MG PO TABS
500.0000 mg | ORAL_TABLET | Freq: Every day | ORAL | Status: DC
  Administered 2018-07-08 – 2018-07-09 (×2): 500 mg via ORAL
  Filled 2018-07-07 (×2): qty 1

## 2018-07-07 MED ORDER — ALLOPURINOL 300 MG PO TABS
450.0000 mg | ORAL_TABLET | Freq: Every day | ORAL | Status: DC
  Administered 2018-07-08 – 2018-07-09 (×2): 450 mg via ORAL
  Filled 2018-07-07 (×2): qty 2

## 2018-07-07 MED ORDER — ASPIRIN EC 81 MG PO TBEC
81.0000 mg | DELAYED_RELEASE_TABLET | Freq: Every day | ORAL | Status: DC
  Administered 2018-07-07: 81 mg via ORAL
  Filled 2018-07-07 (×2): qty 1

## 2018-07-07 MED ORDER — QUINAPRIL HCL 10 MG PO TABS: 40.0000 mg | ORAL_TABLET | Freq: Every day | ORAL | Status: DC

## 2018-07-07 MED ORDER — LISINOPRIL 40 MG PO TABS
40.0000 mg | ORAL_TABLET | Freq: Every day | ORAL | Status: DC
  Administered 2018-07-08 – 2018-07-09 (×2): 40 mg via ORAL
  Filled 2018-07-07 (×2): qty 1

## 2018-07-07 MED ORDER — SODIUM CHLORIDE 0.9% FLUSH
3.0000 mL | Freq: Two times a day (BID) | INTRAVENOUS | Status: DC
  Administered 2018-07-07 – 2018-07-08 (×3): 3 mL via INTRAVENOUS

## 2018-07-07 MED ORDER — SODIUM CHLORIDE 0.9 % IV SOLN: 250.0000 mL | INTRAVENOUS | Status: DC | PRN

## 2018-07-07 MED ORDER — HYDROCHLOROTHIAZIDE 25 MG PO TABS
12.5000 mg | ORAL_TABLET | Freq: Every day | ORAL | Status: DC
  Administered 2018-07-07 – 2018-07-09 (×3): 12.5 mg via ORAL
  Filled 2018-07-07 (×3): qty 1

## 2018-07-07 MED ORDER — PRAVASTATIN SODIUM 40 MG PO TABS
40.0000 mg | ORAL_TABLET | Freq: Every day | ORAL | Status: DC
  Administered 2018-07-07 – 2018-07-09 (×3): 40 mg via ORAL
  Filled 2018-07-07 (×3): qty 1

## 2018-07-07 NOTE — ED Provider Notes (Signed)
Patient placed in Quick Look pathway, seen and evaluated   Chief Complaint: Dizziness, vision change  HPI: Patient presents the emergency department today with complaint of lightheaded spells with dark vision which have been occurring several days a week over the past 2 months.  Patient has had a stroke work-up by his PCP, including MRI and carotid Dopplers.  States he is following up with neurologist in August.  He denies dizziness or vertigo symptoms.  No unilateral weakness, difficulty talking or walking.  Today patient had symptoms that waxed and waned but lasted for a longer time than usual.  No associated chest pain or palpitations.  No shortness of breath.  He stated that she had he had some tingling in his thighs which was unusual.  He has generalized bilateral lower extremity weakness.  History of neck surgery.  ROS:  Positive ROS: (+) Weakness, dark vision, dizziness Negative ROS: (-) Headache, chest pain, palpitations  Physical Exam:   Gen: No distress  Neuro: Awake and Alert  Skin: Warm    Focused Exam: Heart RRR, nml S1,S2, no m/r/g; Lungs CTAB; Abd soft, NT, no rebound or guarding; Ext 2+ pedal pulses bilaterally, no edema.  BP 136/85 (BP Location: Left Arm)   Pulse 71   Temp 98.1 F (36.7 C) (Oral)   Resp 16   SpO2 99%   Plan: Lab work-up, no focal neurological deficits to suggest acute stroke.  Initiation of care has begun. The patient has been counseled on the process, plan, and necessity for staying for the completion/evaluation, and the remainder of the medical screening examination    Carlisle Cater, Hershal Coria 07/07/18 1505    Noemi Chapel, MD 07/08/18 2125

## 2018-07-07 NOTE — ED Notes (Signed)
Admitting provider at bedside.

## 2018-07-07 NOTE — ED Provider Notes (Signed)
[facility] EMERGENCY DEPARTMENT Provider Note   CSN: [id number] Arrival date & time: 07/07/18  1444     History   Chief Complaint Chief Complaint  Patient presents with  . Dizziness    HPI John Bonilla is a 72 y.o. male.  HPI 72 year old male with past medical history as below including obesity, hypertension, hyperlipidemia, here with recurrent episodes of what sounds like near syncope.  The patient states that over the last several months, he has had increasingly frequent episodes of what he initially describes as dizziness.  However, upon further questioning, the patient describes episodes in which she feels almost as if he is going to pass out, with darkened vision and loss of focus in his vision, as well as weakness in his legs.  He has seen his doctor for these episodes, and has had negative MRI and carotid ultrasounds.  Over the last several days, he has had 3 episodes, which are increasing in frequency.  These episodes always occur when he is walking or exerting himself.  He states he has occasionally felt palpitations with these episodes as well.  He endorses a sensation that he is going to pass out with generalized weakness.  He feels like he loses control of his bilateral legs, though on further questioning this seems more so due to generalized weakness.  He has to stop and catch his breath and rest when this happens.  Occasionally he sits down and this does improve his symptoms.  Denies any chest pain over the episodes this week, but he has previously had chest pain.  He did see Dr. Tamala Julian several years ago.  Denies known history of coronary disease.  Has known history of arrhythmias.  Past Medical History:  Diagnosis Date  . Arthritis   . Cancer (HCC)    BLADDER+ SKIN   . Diabetes mellitus without complication ([place])   . GERD (gastroesophageal reflux disease)    RESOLVED   . History of hiatal hernia   . History of kidney stones   . Hyperlipidemia   .  Hypertension   . Morbid obesity ([place])   . Shortness of breath dyspnea    WITH EXERTION   . Sleep apnea    CPAP     Patient Active Problem List   Diagnosis Date Noted  . Lightheadedness 07/07/2018  . OSA (obstructive sleep apnea) 07/07/2018  . Allergic rhinitis 02/28/2018  . Gout 05/18/2017  . Dizziness 10/07/2016  . Anxiety state 10/07/2016  . S/P cervical spinal fusion 11/26/2015  . Severe obesity (BMI >= 40) ([place]) 11/14/2015  . Physical exam 11/14/2015  . Right low back pain 04/29/2015  . Diabetes mellitus with neuropathy ([place]) 02/20/2015  . Hyperlipidemia associated with type 2 diabetes mellitus ([place]) 02/20/2015  . HTN (hypertension) 02/20/2015  . 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 ANTERIOR CERVICAL DECOMP/DISCECTOMY FUSION N/A 11/26/2015   Procedure: C5-6 ANTERIOR CERVICAL DECOMPRESSION/DISCECTOMY FUSION ;  Surgeon: Eustace Moore, MD;  Location: [facility];  Service: Neurosurgery;  Laterality: N/A;  C5-6 ANTERIOR CERVICAL DECOMPRESSION/DISCECTOMY FUSION   . CATARACT EXTRACTION W/ INTRAOCULAR LENS  IMPLANT, BILATERAL    . CYSTOSCOPY  01/2015  . LAPAROSCOPIC GASTRIC BANDING          Home Medications    Prior to Admission medications   Medication Sig Start Date End Date Taking? Authorizing Provider  allopurinol (ZYLOPRIM) 300 MG tablet Take 1.5 tablets (450  mg total) by mouth daily. 12/20/17   Midge Minium, MD  aspirin 81 MG tablet Take 81 mg by mouth at bedtime.     [provider]  Blood Glucose Calibration (ONETOUCH VERIO) High SOLN Use as directed. 12/28/17   Midge Minium, MD  Blood Glucose Monitoring Suppl (ONETOUCH VERIO) w/Device KIT 1 each by Does not apply route daily. To test sugars. Dx. E11.9 12/28/17   Midge Minium, MD  cetirizine (ZYRTEC) 10 MG tablet Take 1 tablet (10 mg total) by mouth daily. Patient not taking: Reported on 04/26/2018 09/02/17   Midge Minium,  MD  colchicine 0.6 MG tablet Take 1 tablet (0.6 mg total) by mouth daily. 12/20/17   Midge Minium, MD  diclofenac sodium (VOLTAREN) 1 % GEL Apply 2 g topically daily as needed (pain).     [provider]  diphenhydramine-acetaminophen (TYLENOL PM) 25-500 MG TABS tablet Take 1 tablet by mouth at bedtime as needed.    [provider]  fluticasone (FLONASE) 50 MCG/ACT nasal spray Place 2 sprays into both nostrils daily. 02/16/18   Brunetta Jeans, PA-C  gabapentin (NEURONTIN) 300 MG capsule Take 900 mg by mouth 2 (two) times daily.    [provider]  glimepiride (AMARYL) 1 MG tablet TAKE 1 TABLET EVERY DAY WITH BREAKFAST 12/20/17   Midge Minium, MD  glucose blood (ONETOUCH VERIO) test strip Use one strips to test sugars 1-2 times daily. Dx. E11.9 12/28/17   Midge Minium, MD  hydrochlorothiazide (HYDRODIURIL) 12.5 MG tablet TAKE 1 TABLET DAILY (DOSE  CHANGE) 06/28/18   Midge Minium, MD  Lancet Devices (ONE TOUCH DELICA LANCING DEV) MISC Please use to test sugars once daily. 12/28/17   Midge Minium, MD  meclizine (ANTIVERT) 25 MG tablet Take 1 tablet (25 mg total) by mouth 3 (three) times daily as needed for dizziness. 05/22/18   Midge Minium, MD  meloxicam (MOBIC) 15 MG tablet Take 1 tablet (15 mg total) by mouth daily. 12/20/17   Midge Minium, MD  Menthol, Topical Analgesic, (ICY HOT) 7.5 % (Roll) MISC Apply 1 each topically daily as needed.    [provider]  metFORMIN (GLUCOPHAGE) 500 MG tablet TAKE 1 TABLET 4 TIMES DAILY 06/19/18   Midge Minium, MD  Multiple Vitamin (MULTIVITAMIN WITH MINERALS) TABS tablet Take 1 tablet by mouth daily.    [provider]  [facility] DELICA LANCETS 14E MISC Please use to test sugars once daily. Dx. E11.9 12/28/17   Midge Minium, MD  pravastatin (PRAVACHOL) 40 MG tablet Take 1 tablet (40 mg total) by mouth daily. 12/20/17   Midge Minium, MD  quinapril (ACCUPRIL) 40 MG  tablet Take 1 tablet (40 mg total) by mouth daily. 03/10/18   Midge Minium, MD  tamsulosin (FLOMAX) 0.4 MG CAPS capsule take 1 capsule by mouth once daily 30 MINUTES FOLLOWING THE SAME MEAL EACH DAY 11/29/17   [provider]  vitamin E 400 UNIT capsule Take 400 Units by mouth daily.    [provider]    Family History Family History  Problem Relation Age of Onset  . Cancer Mother 75       liver/lung  . Heart disease Father 35  . Cancer Sister        lung    Social History Social History   Tobacco Use  . Smoking status: Former Smoker    Last attempt to quit: 12/13/1988  Years since quitting: 29.5  . Smokeless tobacco: Never Used  Substance Use Topics  . Alcohol use: Yes    Comment: RARELY   . Drug use: No     Allergies   Patient has no known allergies.   Review of Systems Review of Systems  Constitutional: Positive for fatigue. Negative for chills and fever.  HENT: Negative for congestion and rhinorrhea.   Eyes: Negative for visual disturbance.  Respiratory: Negative for cough, shortness of breath and wheezing.   Cardiovascular: Positive for palpitations. Negative for chest pain and leg swelling.  Gastrointestinal: Negative for abdominal pain, diarrhea, nausea and vomiting.  Genitourinary: Negative for dysuria and flank pain.  Musculoskeletal: Negative for neck pain and neck stiffness.  Skin: Negative for rash and wound.  Allergic/Immunologic: Negative for immunocompromised state.  Neurological: Positive for dizziness and weakness. Negative for syncope and headaches.  All other systems reviewed and are negative.    Physical Exam Updated Vital Signs BP (!) 160/84   Pulse (!) 58   Temp 98.4 F (36.9 C) (Oral)   Resp 17   Wt 121.3 kg (267 lb 6.4 oz)   SpO2 99%   BMI 38.37 kg/m   Physical Exam  Constitutional: He is oriented to person, place, and time. He appears well-developed and well-nourished. No distress.  HENT:  Head:  Normocephalic and atraumatic.  Eyes: Conjunctivae are normal.  Neck: Neck supple.  Cardiovascular: Normal rate and regular rhythm. Exam reveals no friction rub.  Murmur heard.  Crescendo decrescendo systolic murmur is present with a grade of 1/6. Pulmonary/Chest: Effort normal and breath sounds normal. No respiratory distress. He has no wheezes. He has no rales.  Abdominal: He exhibits no distension.  Musculoskeletal: He exhibits no edema.  Neurological: He is alert and oriented to person, place, and time. He exhibits normal muscle tone.  Skin: Skin is warm. Capillary refill takes less than 2 seconds.  Psychiatric: He has a normal mood and affect.  Nursing note and vitals reviewed.   Neurological Exam:  Mental Status: Alert and oriented to person, place, and time. Attention and concentration normal. Speech clear. Recent memory is intact. Cranial Nerves: Visual fields grossly intact. EOMI and PERRLA. No nystagmus noted. Facial sensation intact at forehead, maxillary cheek, and chin/mandible bilaterally. No facial asymmetry or weakness. Hearing grossly normal. Uvula is midline, and palate elevates symmetrically. Normal SCM and trapezius strength. Tongue midline without fasciculations. Motor: Muscle strength 5/5 in proximal and distal UE and LE bilaterally. No pronator drift. Muscle tone normal. Reflexes: 2+ and symmetrical in all four extremities.  Sensation: Intact to light touch in upper and lower extremities distally bilaterally.  Gait: Normal without ataxia. Coordination: Normal FTN bilaterally.     ED Treatments / Results  Labs (all labs ordered are listed, but only abnormal results are displayed) Labs Reviewed  BASIC METABOLIC PANEL - Abnormal; Notable for the following components:      Result Value   Glucose, Bld 174 (*)    All other components within normal limits  CBC - Abnormal; Notable for the following components:   RBC 4.17 (*)    MCV 105.5 (*)    MCH 34.8 (*)    All  other components within normal limits  CBC - Abnormal; Notable for the following components:   RBC 4.08 (*)    MCV 104.9 (*)    MCH 35.0 (*)    All other components within normal limits  RETICULOCYTES - Abnormal; Notable for the following components:   RBC.  4.08 (*)    All other components within normal limits  CBG MONITORING, ED - Abnormal; Notable for the following components:   Glucose-Capillary 110 (*)    All other components within normal limits  TROPONIN I  MAGNESIUM  BRAIN NATRIURETIC PEPTIDE  TSH  CREATININE, SERUM  CK  VITAMIN B12  FOLATE RBC  GLUCOSE, RANDOM  HEPATIC FUNCTION PANEL  HCV AB W REFLEX TO QUANT PCR    EKG EKG Interpretation  Date/Time:  Friday July 07 2018 14:50:08 EDT Ventricular Rate:  73 PR Interval:  170 QRS Duration: 82 QT Interval:  398 QTC Calculation: 438 R Axis:   3 Text Interpretation:  Normal sinus rhythm Normal ECG No significant change since last tracing Confirmed by Duffy Bruce [phone]) on 07/07/2018 4:02:37 PM   Radiology Dg Chest 2 View  Result Date: 07/07/2018 CLINICAL DATA:  Dizziness, weakness. EXAM: CHEST - 2 VIEW COMPARISON:  Radiographs of February 16, 2018. FINDINGS: The heart size and mediastinal contours are within normal limits. Both lungs are clear. No pneumothorax or pleural effusion is noted. The visualized skeletal structures are unremarkable. IMPRESSION: No active cardiopulmonary disease. Electronically Signed   By: Marijo Conception, M.D.   On: 07/07/2018 17:47    Procedures Procedures (including critical care time)  Medications Ordered in ED Medications  allopurinol (ZYLOPRIM) tablet 450 mg (has no administration in time range)  aspirin EC tablet 81 mg (has no administration in time range)  gabapentin (NEURONTIN) capsule 300 mg (has no administration in time range)  hydrochlorothiazide (HYDRODIURIL) tablet 12.5 mg (has no administration in time range)  metFORMIN (GLUCOPHAGE) tablet 500 mg (has no administration  in time range)  pravastatin (PRAVACHOL) tablet 40 mg (has no administration in time range)  enoxaparin (LOVENOX) injection 60 mg (has no administration in time range)  sodium chloride flush (NS) 0.9 % injection 3 mL (has no administration in time range)  sodium chloride flush (NS) 0.9 % injection 3 mL (has no administration in time range)  0.9 %  sodium chloride infusion (has no administration in time range)  lisinopril (PRINIVIL,ZESTRIL) tablet 40 mg (has no administration in time range)     Initial Impression / Assessment and Plan / ED Course  I have reviewed the triage vital signs and the nursing notes.  Pertinent labs & imaging results that were available during my care of the patient were reviewed by me and considered in my medical decision making (see chart for details).  Clinical Course as of Jul 07 2149  Fri Jul 07, 2018  1802 72 yo M here with intermittent episodes of near syncope and weakness. On ambulation here, pt noted to go into SVT versus AFib/Flutter with increase in HR to 140s, followed by quick return to the 60-70s. He also has a mild, new systolic murmur. Concern for arrhythmia contributing to near syncope episodes. He's had extensive previous neurological work-up and sx are more so generalized weakness, as opposed to focal weakness. Doubt CVA, TIA. Given his worsening sx, will admit for syncope obs.   [CI]    Clinical Course User Index [CI] Duffy Bruce, MD     Final Clinical Impressions(s) / ED Diagnoses   Final diagnoses:  Dizziness  Near syncope  Palpitation    ED Discharge Orders    None       Duffy Bruce, MD 07/07/18 2150

## 2018-07-07 NOTE — ED Notes (Addendum)
Pt reports everything in his vision being "flat" or 2-dimensional/out of focus when his dizziness episodes occur. Pt reports that they have all occurred while standing. Pt denies any chest pain currently or during the episodes.

## 2018-07-07 NOTE — ED Notes (Signed)
Patient transported to X-ray 

## 2018-07-07 NOTE — ED Triage Notes (Signed)
Patient complains of intermittent episodes of dizziness, states this has been happening intermittently for the last two months. Patient alert, oriented and no apparent distress at this time. Has seen PCP for same, called PCP today and was referred to ED for evaluation.

## 2018-07-07 NOTE — Telephone Encounter (Signed)
Patient is calling to give provider some more details about his dizzy spells- his concern is that he has had 3 episodes in 1 week. Saturday- patient was going to pay a bill and had to stop talking and hang on- he states his vision got dark. Sunday- he reports the same thing happened- but to a lesser extent. Patient reports today he was in the store with his wife and he had a spell where he got light headed and weak and had tingling in his thighs. It lasted for at least 10 minutes. His vision changed also- he describes it as going from 3 dimensional to 2 dimensional. Patient states he feels weak. He states at the time he also had to breath by his mouth because he felt he could not get enough air in. Patient states he had no chest pain.Glucose- 146. Call to office to let them know about this patient- they recommend that he go to ED- patient advised and he agrees to go. Reason for Disposition . [1] Weakness (i.e., paralysis, loss of muscle strength) of the face, arm or leg on one side of the body AND [2] sudden onset AND [3] present now  Answer Assessment - Initial Assessment Questions 1. DESCRIPTION: "Describe your dizziness."     Patient reports his vision is effected everytime 2. LIGHTHEADED: "Do you feel lightheaded?" (e.g., somewhat faint, woozy, weak upon standing)     Weak- but not faint 3. VERTIGO: "Do you feel like either you or the room is spinning or tilting?" (i.e. vertigo)     no 4. SEVERITY: "How bad is it?"  "Do you feel like you are going to faint?" "Can you stand and walk?"   - MILD - walking normally   - MODERATE - interferes with normal activities (e.g., work, school)    - SEVERE - unable to stand, requires support to walk, feels like passing out now.      Patient was able to continue shopping because he did not want to leave his wife 5. ONSET:  "When did the dizziness begin?"     Patient has been having dizziness for months- he is calling to report that he has had more frequent  episodes- Sat/Sun and today  6. AGGRAVATING FACTORS: "Does anything make it worse?" (e.g., standing, change in head position)     Not asked 7. HEART RATE: "Can you tell me your heart rate?" "How many beats in 15 seconds?"  (Note: not all patients can do this)       Patient is not sure 8. CAUSE: "What do you think is causing the dizziness?"     Patient is not sure 9. RECURRENT SYMPTOM: "Have you had dizziness before?" If so, ask: "When was the last time?" "What happened that time?"     Yes- he has referral for his symptoms 10. OTHER SYMPTOMS: "Do you have any other symptoms?" (e.g., fever, chest pain, vomiting, diarrhea, bleeding)       Vision changes, tingling in thighs, changes in breathing 11. PREGNANCY: "Is there any chance you are pregnant?" "When was your last menstrual period?"       n/a  Protocols used: DIZZINESS [facility]

## 2018-07-07 NOTE — Telephone Encounter (Signed)
FYI

## 2018-07-07 NOTE — ED Notes (Signed)
Pt given turkey sandwich and diet coke  

## 2018-07-07 NOTE — H&P (Addendum)
History and Physical    John Bonilla DXA:[id number] DOB: [birth year] DOA: 07/07/2018  PCP: Midge Minium, MD  Patient coming from: home   Chief Complaint: lightheadedness  HPI: John Bonilla is a 72 y.o. male with medical history significant for osa (on cpap at night), htn, obesity, bladder cancer (in remission), T2DM (a1c 6.2 06/2018), gout, who presents with above.  Symptoms began insidiously about 6 wks ago. Symptoms are episodic and only happen when standing, though not necessarily initially after standing. Sometimes last just seconds, but can last minutes to hours. Not all symptoms are always present at the same time. Feels very odd when happens, hard for him to describe. Feels lightheaded. Sometimes vision goes somewhat dark. Once had palpitations. Never had chest pain or SOB. Feels very weak when these episodes happen. Weakness is not focal. No double vision. No history arrhythmia. No history significant neurologic or cardiac problems. Has checked sugar intermittently including at least once when had symptoms, always 100 or greater. No tremor. Has not fallen. No LOC. Has had w/u by pcp. MRI negative, carotid dopplers with less than 50% stenosis. Has neuro appt scheduled next month. Former smoker, no other toxic habits. No recent med changes.   ED Course: labs, orthostats (negative per ED physician). Ambulated and significant trouble ambulating, required ED provider's assistance.  Review of Systems: As per HPI otherwise 10 point review of systems negative.    Past Medical History:  Diagnosis Date  . Arthritis   . Cancer (HCC)    BLADDER+ SKIN   . Diabetes mellitus without complication ([place])   . GERD (gastroesophageal reflux disease)    RESOLVED   . History of hiatal hernia   . History of kidney stones   . Hyperlipidemia   . Hypertension   . Morbid obesity ([place])   . Shortness of breath dyspnea    WITH EXERTION   . Sleep apnea    CPAP     Past Surgical History:    Procedure Laterality Date  . ANTERIOR CERVICAL DECOMP/DISCECTOMY FUSION N/A 11/26/2015   Procedure: C5-6 ANTERIOR CERVICAL DECOMPRESSION/DISCECTOMY FUSION ;  Surgeon: Eustace Moore, MD;  Location: [facility];  Service: Neurosurgery;  Laterality: N/A;  C5-6 ANTERIOR CERVICAL DECOMPRESSION/DISCECTOMY FUSION   . CATARACT EXTRACTION W/ INTRAOCULAR LENS  IMPLANT, BILATERAL    . CYSTOSCOPY  01/2015  . LAPAROSCOPIC GASTRIC BANDING       reports that he quit smoking about 29 years ago. He has never used smokeless tobacco. He reports that he drinks alcohol. He reports that he does not use drugs.  No Known Allergies  Family History  Problem Relation Age of Onset  . Cancer Mother 52       liver/lung  . Heart disease Father 14  . Cancer Sister        lung    Prior to Admission medications   Medication Sig Start Date End Date Taking? Authorizing Provider  allopurinol (ZYLOPRIM) 300 MG tablet Take 1.5 tablets (450 mg total) by mouth daily. 12/20/17   Midge Minium, MD  aspirin 81 MG tablet Take 81 mg by mouth at bedtime.     [provider]  Blood Glucose Calibration (ONETOUCH VERIO) High SOLN Use as directed. 12/28/17   Midge Minium, MD  Blood Glucose Monitoring Suppl (ONETOUCH VERIO) w/Device KIT 1 each by Does not apply route daily. To test sugars. Dx. E11.9 12/28/17   Midge Minium, MD  cetirizine (ZYRTEC) 10 MG tablet Take 1  tablet (10 mg total) by mouth daily. Patient not taking: Reported on 04/26/2018 09/02/17   Midge Minium, MD  colchicine 0.6 MG tablet Take 1 tablet (0.6 mg total) by mouth daily. 12/20/17   Midge Minium, MD  diclofenac sodium (VOLTAREN) 1 % GEL Apply 2 g topically daily as needed (pain).     [provider]  diphenhydramine-acetaminophen (TYLENOL PM) 25-500 MG TABS tablet Take 1 tablet by mouth at bedtime as needed.    [provider]  fluticasone (FLONASE) 50 MCG/ACT nasal spray Place 2 sprays into both nostrils  daily. 02/16/18   Brunetta Jeans, PA-C  gabapentin (NEURONTIN) 300 MG capsule Take 900 mg by mouth 2 (two) times daily.    [provider]  glimepiride (AMARYL) 1 MG tablet TAKE 1 TABLET EVERY DAY WITH BREAKFAST 12/20/17   Midge Minium, MD  glucose blood (ONETOUCH VERIO) test strip Use one strips to test sugars 1-2 times daily. Dx. E11.9 12/28/17   Midge Minium, MD  hydrochlorothiazide (HYDRODIURIL) 12.5 MG tablet TAKE 1 TABLET DAILY (DOSE  CHANGE) 06/28/18   Midge Minium, MD  Lancet Devices (ONE TOUCH DELICA LANCING DEV) MISC Please use to test sugars once daily. 12/28/17   Midge Minium, MD  meclizine (ANTIVERT) 25 MG tablet Take 1 tablet (25 mg total) by mouth 3 (three) times daily as needed for dizziness. 05/22/18   Midge Minium, MD  meloxicam (MOBIC) 15 MG tablet Take 1 tablet (15 mg total) by mouth daily. 12/20/17   Midge Minium, MD  Menthol, Topical Analgesic, (ICY HOT) 7.5 % (Roll) MISC Apply 1 each topically daily as needed.    [provider]  metFORMIN (GLUCOPHAGE) 500 MG tablet TAKE 1 TABLET 4 TIMES DAILY 06/19/18   Midge Minium, MD  Multiple Vitamin (MULTIVITAMIN WITH MINERALS) TABS tablet Take 1 tablet by mouth daily.    [provider]  [facility] DELICA LANCETS 93A MISC Please use to test sugars once daily. Dx. E11.9 12/28/17   Midge Minium, MD  pravastatin (PRAVACHOL) 40 MG tablet Take 1 tablet (40 mg total) by mouth daily. 12/20/17   Midge Minium, MD  quinapril (ACCUPRIL) 40 MG tablet Take 1 tablet (40 mg total) by mouth daily. 03/10/18   Midge Minium, MD  tamsulosin (FLOMAX) 0.4 MG CAPS capsule take 1 capsule by mouth once daily 30 MINUTES FOLLOWING THE SAME MEAL EACH DAY 11/29/17   [provider]  vitamin E 400 UNIT capsule Take 400 Units by mouth daily.    [provider]    Physical Exam: Vitals:   07/07/18 1645 07/07/18 1715 07/07/18 1908 07/07/18 1915  BP: 123/77 119/87 (!)  155/90 (!) 146/92  Pulse: 63 72 (!) 56 (!) 59  Resp: _0 Temp:      TempSrc:      SpO2: 97% 97% 98% 98%    Constitutional: No acute distress Head: Atraumatic Eyes: Conjunctiva clear ENM: Moist mucous membranes. Normal dentition.  Neck: Supple Respiratory: Clear to auscultation bilaterally, no wheezing/rales/rhonchi. Normal respiratory effort. No accessory muscle use. . Cardiovascular: Regular rate and rhythm. Soft systolic murmur; norubs/gallops. Abdomen: Non-tender, non-distended. No masses. No rebound or guarding. Positive bowel sounds. Musculoskeletal: No joint deformity upper and lower extremities. Normal ROM, no contractures. Normal muscle tone.  Skin: No rashes, lesions, or ulcers.  Extremities: No peripheral edema. Palpable peripheral pulses. Neurologic: Alert, moving all 4 extremities. Cn 2-12 grossly intact. 5/5 upper and lower  strength. Psychiatric: Normal insight and judgement.   Labs on Admission: I have personally reviewed following labs and imaging studies  CBC: Recent Labs  Lab 07/07/18 1508  WBC 7.2  HGB 14.5  HCT 44.0  MCV 105.5*  PLT 277   Basic Metabolic Panel: Recent Labs  Lab 07/07/18 1508 07/07/18 1705  NA 139  --   K 4.3  --   CL 102  --   CO2 24  --   GLUCOSE 174*  --   BUN 23  --   CREATININE 1.09  --   CALCIUM 9.2  --   MG  --  2.0   GFR: Estimated Creatinine Clearance: 82.3 mL/min (by C-G formula based on SCr of 1.09 mg/dL). Liver Function Tests: No results for input(s): AST, ALT, ALKPHOS, BILITOT, PROT, ALBUMIN in the last 168 hours. No results for input(s): LIPASE, AMYLASE in the last 168 hours. No results for input(s): AMMONIA in the last 168 hours. Coagulation Profile: No results for input(s): INR, PROTIME in the last 168 hours. Cardiac Enzymes: Recent Labs  Lab 07/07/18 1705  TROPONINI <0.03   BNP (last 3 results) No results for input(s): PROBNP in the last 8760 hours. HbA1C: No results for input(s): HGBA1C in  the last 72 hours. CBG: Recent Labs  Lab 07/07/18 1705  GLUCAP 110*   Lipid Profile: No results for input(s): CHOL, HDL, LDLCALC, TRIG, CHOLHDL, LDLDIRECT in the last 72 hours. Thyroid Function Tests: No results for input(s): TSH, T4TOTAL, FREET4, T3FREE, THYROIDAB in the last 72 hours. Anemia Panel: No results for input(s): VITAMINB12, FOLATE, FERRITIN, TIBC, IRON, RETICCTPCT in the last 72 hours. Urine analysis:    Component Value Date/Time   COLORURINE YELLOW 05/20/2014 [place] 05/20/2014 1126   LABSPEC 1.021 05/20/2014 1126   PHURINE 6.0 05/20/2014 1126   GLUCOSEU >1000 (A) 05/20/2014 1126   HGBUR NEGATIVE 05/20/2014 1126   BILIRUBINUR NEGATIVE 05/20/2014 [place] 05/20/2014 1126   PROTEINUR NEGATIVE 05/20/2014 1126   UROBILINOGEN 1.0 05/20/2014 1126   NITRITE NEGATIVE 05/20/2014 1126   LEUKOCYTESUR NEGATIVE 05/20/2014 1126    Radiological Exams on Admission: Dg Chest 2 View  Result Date: 07/07/2018 CLINICAL DATA:  Dizziness, weakness. EXAM: CHEST - 2 VIEW COMPARISON:  Radiographs of February 16, 2018. FINDINGS: The heart size and mediastinal contours are within normal limits. Both lungs are clear. No pneumothorax or pleural effusion is noted. The visualized skeletal structures are unremarkable. IMPRESSION: No active cardiopulmonary disease. Electronically Signed   By: Marijo Conception, M.D.   On: 07/07/2018 17:47    EKG: Independently reviewed. rrr, normal axis, no ischemic changes, no hypertrophy  Assessment/Plan Active Problems:   Diabetes mellitus with neuropathy (HCC)   HTN (hypertension)   Gout   Lightheadedness   OSA (obstructive sleep apnea)   # Lightheadedness - multiple non-specific symptoms. Only happen when standing, often when ambulating. Has not experienced hypoglycemia when this happens. Very well could represent paroxysmal arrhythmia such as a-fib; has not received such dx in the past. EKG today wnl. No recent med  changes; is taking relatively high dose of gabapentin and also a diuretic, but sounds like those doses are stable. Recent mri and carotid dopplers without significant findings. No focal neurologic symptoms or findings on exam. orthostats negative in ED per ED physician. Primary reason for admission is patient had an episode while ambulating w/ ED provider and almost fell, had to be helped back into bed.  - up with assistance -  telemetry - AM EKG - TTE - consider outpt event monitor if hospital w/u negative - trial decreaseing gabapentin to 300 bid from 900 bid - if bps remain wnl to low consider holding hctz - b12, folate, tsh, ck - consider outpt ophtho eval given some episodes with transient partial vision loss. Mri unrevealing  # T2DM - recent a1c 6.2. Glucose wnl on admit - continue home metformin, glimepiride - daily fasting sugar  # macrocytosis - not anemic. 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

## 2018-07-07 NOTE — ED Notes (Addendum)
Upon ambulation around the unit pt reported feeling like he didn't have full control of his legs, especially his right foot.  Pt SP02 stayed within normal limits.  HR jumped up to 140's momentarily but then hung out in the 80's. MD Isaacs notified.

## 2018-07-08 ENCOUNTER — Inpatient Hospital Stay (HOSPITAL_COMMUNITY): Payer: Medicare HMO

## 2018-07-08 DIAGNOSIS — M1A9XX0 Chronic gout, unspecified, without tophus (tophi): Secondary | ICD-10-CM

## 2018-07-08 DIAGNOSIS — R42 Dizziness and giddiness: Principal | ICD-10-CM

## 2018-07-08 DIAGNOSIS — E1140 Type 2 diabetes mellitus with diabetic neuropathy, unspecified: Secondary | ICD-10-CM

## 2018-07-08 DIAGNOSIS — I5030 Unspecified diastolic (congestive) heart failure: Secondary | ICD-10-CM

## 2018-07-08 DIAGNOSIS — I10 Essential (primary) hypertension: Secondary | ICD-10-CM

## 2018-07-08 DIAGNOSIS — E538 Deficiency of other specified B group vitamins: Secondary | ICD-10-CM

## 2018-07-08 LAB — ECHOCARDIOGRAM COMPLETE: Weight: 4278.41 oz

## 2018-07-08 LAB — GLUCOSE, RANDOM: GLUCOSE: 113 mg/dL — AB (ref 70–99)

## 2018-07-08 MED ORDER — DIPHENHYDRAMINE HCL 25 MG PO CAPS
25.0000 mg | ORAL_CAPSULE | Freq: Once | ORAL | Status: AC
  Administered 2018-07-08: 25 mg via ORAL
  Filled 2018-07-08: qty 1

## 2018-07-08 MED ORDER — ACETAMINOPHEN 325 MG PO TABS
650.0000 mg | ORAL_TABLET | Freq: Four times a day (QID) | ORAL | Status: DC | PRN
  Administered 2018-07-08 (×2): 650 mg via ORAL
  Filled 2018-07-08 (×3): qty 2

## 2018-07-08 MED ORDER — VITAMIN B-12 100 MCG PO TABS
500.0000 ug | ORAL_TABLET | Freq: Every day | ORAL | Status: DC
  Administered 2018-07-09: 500 ug via ORAL
  Filled 2018-07-08: qty 5

## 2018-07-08 MED ORDER — CYANOCOBALAMIN 1000 MCG/ML IJ SOLN
1000.0000 ug | Freq: Once | INTRAMUSCULAR | Status: AC
  Administered 2018-07-08: 1000 ug via INTRAMUSCULAR
  Filled 2018-07-08: qty 1

## 2018-07-08 NOTE — Progress Notes (Signed)
*  PRELIMINARY RESULTS* Echocardiogram 2D Echocardiogram has been performed.  Leavy Cella 07/08/2018, 3:54 PM

## 2018-07-08 NOTE — Progress Notes (Signed)
PROGRESS NOTE    Patient: John Bonilla     PCP: Midge Minium, MD                    DOB: 17-Mar-1946            DOA: 07/07/2018 [id number]             DOS: 07/08/2018, 12:46 PM   LOS: 1 day   Date of Service: The patient was seen and examined on 07/08/2018  Subjective:  Patient was seen and examined this morning, stable no acute distress.  Patient reports that he is asymptomatic as long as he is in bed, he has not been ambulating or exerting himself yet. He is complaining of no dizziness or lightheadedness at rest. ----------------------------------------------------------------------------------------------------------------------  Brief Narrative:  John Bonilla is a 73 y.o. male with medical history significant for osa (on cpap at night), htn, obesity, bladder cancer (in remission), T2DM (a1c 6.2 06/2018), gout, presented with dizziness, lightheadedness with symptoms of presyncope. Feels very weak when these episodes happen. Weakness is not focal. No double vision. No history arrhythmia. No history significant neurologic or cardiac problems. Has checked sugar intermittently including at least once when had symptoms, always 100 or greater. No tremor. Has not fallen. No LOC. Has had w/u by pcp. MRI negative, carotid dopplers with less than 50% stenosis. Has neuro appt scheduled next month. Former smoker, no other toxic habits. No recent med changes.   labs, orthostats (negative per ED physician). Ambulated and significant trouble ambulating, required ED provider's assistance  Active Problems:   Diabetes mellitus with neuropathy ([place])   HTN (hypertension)   Gout   Lightheadedness   OSA (obstructive sleep apnea)   B12 deficiency   Assessment & Plan:   Dizziness/lightheadedness/presyncope -Difficulty ambulating, asymptomatic with no exertion in bed. Culprit factors such as hypoglycemia, orthostatic hypotension, possible TIA/CVA/MI was ruled out No change in EKG or scan  of the head.,  No focal neurological findings. -We will continue to monitor patient on telemetry bed no dysrhythmia noted -Pending 2D echocardiogram -Bilateral carotid studies revealing: At bifurcation ICA plaques, resulting in less than 50% stenosis -Patient may be considered outpatient Holter monitor -Also his gabapentin has been reduced from 900-300 twice daily -Monitoring blood pressure considering holding blood pressure medication including HCTZ -Labs Foley TSH CK troponin negative  -MRI of the head -within normal limits negative any meds or acute on normalities.  B12 deficiency -Low level B12 at 127 (nl range 180-914) -Patient will be administered IV Foley 1 g, followed by 500 mg orally in a.m.   T2DM - recent a1c 6.2. Glucose wnl on admit - continue home metformin, glimepiride - SSI  Macrocytosis - not anemic. 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 Bilateral carotid bifurcation and proximal ICA plaque resulting in less than 50% diameter stenosis. 2.  Antegrade bilateral vertebral arterial flow.   Objective: Vitals:  07/07/18 1908 07/07/18 1915 07/07/18 1955 07/08/18 0600  BP: (!) 155/90 (!) 146/92 (!) 160/84 131/80  Pulse: (!) 56 (!) 59 (!) 58 (!) 57  Resp: 15 16 17 17   Temp:   98.4 F (36.9 C) 98.1 F (36.7 C)  TempSrc:   Oral Oral  SpO2: 98% 98% 99% 97%    Weight:   121.3 kg (267 lb 6.4 oz) 121.3 kg (267 lb 6.4 oz)    Intake/Output Summary (Last 24 hours) at 07/08/2018 1246 Last data filed at 07/08/2018 0900 Gross per 24 hour  Intake 1080 ml  Output -  Net 1080 ml   Filed Weights   07/07/18 1955 07/08/18 0600  Weight: 121.3 kg (267 lb 6.4 oz) 121.3 kg (267 lb 6.4 oz)    Examination:  General exam: Appears calm and comfortable  Psychiatry: Judgement and insight appear normal. Mood & affect appropriate. HEENT: WNLs Respiratory system: Clear to auscultation. Respiratory effort normal. Cardiovascular system: S1 & S2 heard, RRR. No JVD, murmurs, rubs, gallops or clicks. No pedal edema. Gastrointestinal system: Abd. nondistended, soft and nontender. No organomegaly or masses felt. Normal bowel sounds heard. Central nervous system: Alert and oriented. No focal neurological deficits. Extremities: Symmetric 5 x 5 power. Skin: No rashes, lesions or ulcers Wounds:   Data Reviewed: I have personally reviewed following labs and imaging studies  CBC: Recent Labs  Lab 07/07/18 1508 07/07/18 2039  WBC 7.2 6.2  HGB 14.5 14.3  HCT 44.0 42.8  MCV 105.5* 104.9*  PLT 234 960   Basic Metabolic Panel: Recent Labs  Lab 07/07/18 1508 07/07/18 1705 07/07/18 2039 07/08/18 0407  NA 139  --   --   --   K 4.3  --   --   --   CL 102  --   --   --   CO2 24  --   --   --   GLUCOSE 174*  --   --  113*  BUN 23  --   --   --   CREATININE 1.09  --  1.05  --   CALCIUM 9.2  --   --   --   MG  --  2.0  --   --    GFR: Estimated Creatinine Clearance: 84.2 mL/min (by C-G formula based on SCr of 1.05 mg/dL). Liver Function Tests: Recent Labs  Lab 07/07/18 2039  AST 27  ALT 24  ALKPHOS 47  BILITOT 0.9  PROT 6.6  ALBUMIN 3.9   No results for input(s): LIPASE, AMYLASE in the last 168 hours. No results for input(s): AMMONIA in the last 168 hours. Coagulation Profile: No results for input(s): INR, PROTIME in the last 168 hours. Cardiac  Enzymes: Recent Labs  Lab 07/07/18 1705 07/07/18 2039  CKTOTAL  --  144  TROPONINI <0.03  --    BNP (last 3 results) No results for input(s): PROBNP in the last 8760 hours. HbA1C: No results for input(s): HGBA1C in the last 72 hours. CBG: Recent Labs  Lab 07/07/18 1705  GLUCAP 110*   Lipid Profile: No results for input(s): CHOL, HDL, LDLCALC, TRIG, CHOLHDL, LDLDIRECT in the last 72 hours. Thyroid Function Tests: Recent Labs    07/07/18 1705  TSH 2.151   Anemia Panel: Recent Labs    07/07/18 2039  VITAMINB12 172*  RETICCTPCT 1.3   Sepsis Labs: No results for input(s): PROCALCITON, LATICACIDVEN in the last 168 hours.  No results found for this or any previous visit (from the past  240 hour(s)).    Radiology Studies: Dg Chest 2 View  Result Date: 07/07/2018 CLINICAL DATA:  Dizziness, weakness. EXAM: CHEST - 2 VIEW COMPARISON:  Radiographs of February 16, 2018. FINDINGS: The heart size and mediastinal contours are within normal limits. Both lungs are clear. No pneumothorax or pleural effusion is noted. The visualized skeletal structures are unremarkable. IMPRESSION: No active cardiopulmonary disease. Electronically Signed   By: Marijo Conception, M.D.   On: 07/07/2018 17:47    Scheduled Meds: . allopurinol  450 mg Oral Daily  . aspirin EC  81 mg Oral QHS  . enoxaparin (LOVENOX) injection  60 mg Subcutaneous Q24H  . gabapentin  300 mg Oral BID  . hydrochlorothiazide  12.5 mg Oral Daily  . lisinopril  40 mg Oral Daily  . metFORMIN  500 mg Oral Q breakfast  . pravastatin  40 mg Oral Daily  . sodium chloride flush  3 mL Intravenous Q12H  . [START ON 07/09/2018] vitamin B-12  500 mcg Oral Daily   Continuous Infusions: . sodium chloride      Time spent: >25 minutes  Deatra James, MD [facility],  Pager [phone]  If 7PM-7AM, please contact night-coverage [email or web address]   Password [facility]  07/08/2018, 12:46 PM

## 2018-07-09 LAB — GLUCOSE, RANDOM: Glucose, Bld: 133 mg/dL — ABNORMAL HIGH (ref 70–99)

## 2018-07-09 MED ORDER — GABAPENTIN 300 MG PO CAPS: 300.0000 mg | ORAL_CAPSULE | Freq: Two times a day (BID) | ORAL | 1 refills | Status: DC

## 2018-07-09 MED ORDER — CYANOCOBALAMIN 500 MCG PO TABS: 500.0000 ug | ORAL_TABLET | Freq: Every day | ORAL | 1 refills | Status: AC

## 2018-07-09 NOTE — Evaluation (Signed)
Physical Therapy Evaluation Patient Details Name: John Bonilla MRN: [id number] DOB: 1946-01-14 Today's Date: 07/09/2018   History of Present Illness  Pt is a 72 y.o. male who presented to the ED with recurrent episodes of dizziness and near syncope. He has a PMH significant for obesity, hypertension, hyperlipidemia, diabetes mellitus without complication, morbid obesity. Orthostatic BP negative in the ED. Recent MRI negative for acute abnormalities.     Clinical Impression  Patient evaluated by Physical Therapy with no further acute PT needs identified. All education has been completed and the patient has no further questions. Pt independent PTA, today ambulating at baseline, 22/24 DGI, no CP or LOB, SOB during gait. Ambulating unit without AD.  HR ranged 76-107 with activity, SpO2 WNL on RA. No dizziness with positional changes.  See below for any follow-up Physical Therapy or equipment needs. PT is signing off. Thank you for this referral.     Follow Up Recommendations No PT follow up    Equipment Recommendations  None recommended by PT    Recommendations for Other Services       Precautions / Restrictions Precautions Precaution Comments: pt with episodes of lightheadedness; none during OT eval Restrictions Weight Bearing Restrictions: No      Mobility  Bed Mobility               General bed mobility comments: OOB in recliner on my arrival.   Transfers Overall transfer level: Independent                  Ambulation/Gait Ambulation/Gait assistance: Independent Gait Distance (Feet): 650 Feet Assistive device: None Gait Pattern/deviations: WFL(Within Functional Limits) Gait velocity: WNL Gait velocity interpretation: >4.37 ft/sec, indicative of normal walking speed General Gait Details: DGI 22/24, VSS on RA, no CP or SOB  Stairs Stairs: Yes Stairs assistance: Independent Stair Management: Alternating pattern Number of Stairs: 8 General stair  comments: no concerns, ambulating stairs independently. 20 seconds of lightheadness resolved on its own after stairs, HR 107, WNL SpO2  Wheelchair Mobility    Modified Rankin (Stroke Patients Only)       Balance Overall balance assessment: No apparent balance deficits (not formally assessed)                                           Pertinent Vitals/Pain Pain Assessment: No/denies pain    Home Living Family/patient expects to be discharged to:: Private residence Living Arrangements: Spouse/significant other Available Help at Discharge: Family;Available 24 hours/day Type of Home: House Home Access: Stairs to enter Entrance Stairs-Rails: Right;Left;Can reach both Entrance Stairs-Number of Steps: 2 Home Layout: One level Home Equipment: Shower seat;Grab bars - tub/shower      Prior Function Level of Independence: Independent         Comments: 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[employer]; does sudoko puzzles; does majority of the driving      Journalist, newspaper        Extremity/Trunk Assessment   Upper Extremity Assessment Upper Extremity Assessment: Overall WFL for tasks assessed    Lower Extremity Assessment Lower Extremity Assessment: Overall WFL for tasks assessed       Communication   Communication: No difficulties  Cognition Arousal/Alertness: Awake/alert Behavior During Therapy: WFL for tasks assessed/performed Overall Cognitive Status: Within Functional Limits for tasks assessed  General Comments General comments (skin integrity, edema, etc.): BP prior to ambulation 131/86 and following ambulation 148/88. Pt with HR 68-81 throughout. Educated pt concerning energy conservation strategies.     Exercises     Assessment/Plan    PT Assessment Patent does not need any further PT services  PT Problem List         PT Treatment Interventions      PT Goals (Current goals can be  found in the Care Plan section)  Acute Rehab PT Goals Patient Stated Goal: to go home and follow up with cardiology PT Goal Formulation: With patient Time For Goal Achievement: 07/16/18 Potential to Achieve Goals: Good    Frequency     Barriers to discharge        Co-evaluation               AM-PAC PT "6 Clicks" Daily Activity  Outcome Measure Difficulty turning over in bed (including adjusting bedclothes, sheets and blankets)?: None Difficulty moving from lying on back to sitting on the side of the bed? : None Difficulty sitting down on and standing up from a chair with arms (e.g., wheelchair, bedside commode, etc,.)?: None Help needed moving to and from a bed to chair (including a wheelchair)?: None Help needed walking in hospital room?: None Help needed climbing 3-5 steps with a railing? : None 6 Click Score: 24    End of Session Equipment Utilized During Treatment: Gait belt Activity Tolerance: Patient tolerated treatment well Patient left: in chair;with call bell/phone within reach;with family/visitor present Nurse Communication: Mobility status PT Visit Diagnosis: Dizziness and giddiness (R42)    Time: 3846-6599 PT Time Calculation (min) (ACUTE ONLY): 25 min   Charges:   PT Evaluation $PT Eval Low Complexity: 1 Low PT Treatments $Gait Training: 8-22 mins       Reinaldo Berber, PT, DPT Acute Rehab Services Pager: [phone]    Reinaldo Berber 07/09/2018, 12:44 PM

## 2018-07-09 NOTE — Evaluation (Addendum)
Occupational Therapy Evaluation and Discharge Patient Details Name: John Bonilla MRN: [id number] DOB: [birth year] Today's Date: 07/09/2018    History of Present Illness Pt is a 72 y.o. male who presented to the ED with recurrent episodes of dizziness and near syncope. He has a PMH significant for obesity, hypertension, hyperlipidemia, diabetes mellitus without complication, morbid obesity. Orthostatic BP negative in the ED. Recent MRI negative for acute abnormalities.   Clinical Impression   PTA, pt was independent with ADL and functional mobility. He assists his wife with home management as she is recovering from hip surgery. 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 He currently is able to complete ADL tasks and ADL transfers independently in hospital setting. He does report some fatigue following multiple standing and ambulating tasks and educated pt concerning energy conservation strategies and he verbalizes understanding. No episodes of dizziness/lightheadedness during session and vital signs listed below. At this time, no further OT needs identified and OT will sign off acutely. Thank you for this referral.    Follow Up Recommendations  No OT follow up;Supervision - Intermittent    Equipment Recommendations  None recommended by OT    Recommendations for Other Services       Precautions / Restrictions Precautions Precaution Comments: pt with episodes of lightheadedness; none during OT eval Restrictions Weight Bearing Restrictions: No      Mobility Bed Mobility               General bed mobility comments: OOB in recliner on my arrival.   Transfers Overall transfer level: Independent                    Balance Overall balance assessment: No apparent balance deficits (not formally assessed)                                         ADL either performed or assessed with clinical judgement   ADL Overall ADL's : Independent                                        General ADL Comments: Able to ambulate to sink, stand for grooming tasks, and ambulate in hallway consecutively with no therapeutic rest breaks.      Vision Baseline Vision/History: No visual deficits Patient Visual Report: No change from baseline Vision Assessment?: No apparent visual deficits Additional Comments: Making eye contact, tracking, and identifying objects throughout all apsects of visual fields.      Perception     Praxis      Pertinent Vitals/Pain Pain Assessment: No/denies pain     Hand Dominance     Extremity/Trunk Assessment Upper Extremity Assessment Upper Extremity Assessment: Overall WFL for tasks assessed   Lower Extremity Assessment Lower Extremity Assessment: Overall WFL for tasks assessed       Communication Communication Communication: No difficulties   Cognition Arousal/Alertness: Awake/alert Behavior During Therapy: WFL for tasks assessed/performed Overall Cognitive Status: Within Functional Limits for tasks assessed                                     General Comments  BP prior to ambulation 131/86 and following ambulation 148/88. Pt with HR 68-81 throughout. Educated  pt concerning energy conservation strategies.     Exercises     Shoulder Instructions      Home Living Family/patient expects to be discharged to:: Private residence Living Arrangements: Spouse/significant other Available Help at Discharge: Family;Available 24 hours/day(wife with recent THA and unable to provide much assist) Type of Home: House Home Access: Stairs to enter [employer] of Steps: 2 Entrance Stairs-Rails: Right;Left;Can reach both Home Layout: One level     Bathroom Shower/Tub: Chief Strategy Officer: Shower seat;Grab bars - tub/shower          Prior Functioning/Environment Level of Independence: Independent        Comments: 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[employer]; does sudoko puzzles; does majority of the driving         OT Problem List: Decreased activity tolerance      OT Treatment/Interventions:      OT Goals(Current goals can be found in the care plan section) Acute Rehab OT Goals Patient Stated Goal: to figure out what is going on and causing these spells OT Goal Formulation: With patient  OT Frequency:     Barriers to D/C:            Co-evaluation              AM-PAC PT "6 Clicks" Daily Activity     Outcome Measure Help from another person eating meals?: None Help from another person taking care of personal grooming?: None Help from another person toileting, which includes using toliet, bedpan, or urinal?: None Help from another person bathing (including washing, rinsing, drying)?: None Help from another person to put on and taking off regular upper body clothing?: None Help from another person to put on and taking off regular lower body clothing?: None 6 Click Score: 24   End of Session Nurse Communication: Mobility status  Activity Tolerance: Patient tolerated treatment well Patient left: in chair;with call bell/phone within reach  OT Visit Diagnosis: Dizziness and giddiness (R42)                Time: 2297-9892 OT Time Calculation (min): 26 min Charges:  OT General Charges $OT Visit: 1 Visit OT Evaluation $OT Eval Low Complexity: 1 Low OT Treatments $Self Care/Home Management : 8-22 mins  Norman Herrlich, MS OTR/L  Pager: [place] 07/09/2018, 10:46 AM

## 2018-07-09 NOTE — Discharge Summary (Signed)
Physician Discharge Summary [facility]    Patient: John Bonilla                   Admit date: 07/07/2018   DOB: June 12, 1946             Discharge date:07/09/2018/12:07 PM WLS:[id number]                           PCP: Midge Minium, MD Recommendations for Outpatient Follow-up:   1.  Please follow-up with your primary care physician within 1-2 weeks. 2.  Follow-up with your cardiologist for possible Holter monitor to further work-up your dizziness/lightheadedness  Discharge Condition: Stable  CODE STATUS:  Full code   Diet recommendation: Cardiac diet /  Diabetic diet   ----------------------------------------------------------------------------------------------------------------------  Discharge Diagnoses:   Active Problems:   Diabetes mellitus with neuropathy (HCC)   HTN (hypertension)   Gout   Lightheadedness   OSA (obstructive sleep apnea)   B12 deficiency   History of present illness :  John Bonilla a 72 y.o.malewith medical history significant forosa (on cpap at night), htn, obesity, bladder cancer (in remission), T2DM (a1c 6.2 06/2018), gout, presented with dizziness, lightheadedness with symptoms of presyncope. Feels very weak when these episodes happen. Weakness is not focal. No double vision. No history arrhythmia. No history significant neurologic or cardiac problems. Has checked sugar intermittently including at least once when had symptoms, always 100 or greater. No tremor. Has not fallen. No LOC. Has had w/u by pcp. MRI negative, carotid dopplers with less than 50% stenosis. Has neuro appt scheduled next month. Former smoker, no other toxic habits. No recent med changes.  labs, orthostats (negative per ED physician). Ambulated and significant trouble ambulating, required ED provider's assistance  Hospital course / Brief Summary:  He was was ultimately admitted for further work-up of dizziness/lightheadedness/presyncope. bilateral  carotid studies were within normal limits less than 50% stenosis bilaterally.  IMA globin A1c, TSH, rest of labs within normal limits including cardiac enzymes, 2D echocardiogram revealed ejection fraction of 60 to 65%, severe LVH, mild diastolic congestive heart failure.  Lab work-up revealed vitamin B12 deficiency -1 g of IV B12 was given started on oral B12 supplements No significant finding or orthostatic with ambulation activity. Record was also reviewed he has had an MRI with and without contrast on 05/20/2018, essentially within normal limits. She was cleared for discharge.  Instructed to reduce his gabapentin from 900 to 300 mg p.o. twice daily, We also scription for reduced gabapentin and B12 supplements was provided ------------------------------------------------------------------------------------------------------------------------------------------------------ For detailed hospital course please see below:    Dizziness/lightheadedness/presyncope -Difficulty ambulating, asymptomatic with in bed. Culprit factors such as hypoglycemia, orthostatic hypotension, possible TIA/CVA/MI was ruled out No change in EKG or scan of the head.,  No focal neurological findings. -We will continue to monitor patient on telemetry bed no dysrhythmia noted -2D echocardiogram -reported LVH, mild diastolic congestive heart failure, ejection fracture 60 to 65% -Bilateral carotid studies revealing: At bifurcation ICA plaques, resulting in less than 50% stenosis -Patient may be considered outpatient Holter monitor -Also his gabapentin has been reduced from 900-300 twice daily -Monitoring blood pressure considering holding blood pressure medication including HCTZ -Labs Foley TSH CK troponin negative  -MRI of the head -within normal limits negative any meds or acute on normalities.  B12 deficiency -Low level B12 at 127 (nl range 180-914) -Patient was administered IV Foley 1 g, followed by 500 mg orally in  a.m.   T2DM - recent a1c 6.2. Glucose wnl on admit - continue home metformin, glimepiride - SSI  Macrocytosis - not anemic.Denies heavy drinking or hx liver disease -Due to vitamin B12 deficiency, normal TSH and folate - hepatic function panel, hcv antibody, reticulocytes - WNL  - myelodysplastic w/u, likely as outpt, if above labs unrevealing  osa - cpap qhs  htn - here bp wnl - continue home quinapril, hctz, asa, pravastatin  back pain - gabapentin as above  Gout - continue home allopurinol  Disposition-clear to discharge home ----------------------------------------------------------------------------------------------------------------------------------------  Discharge Instructions:   Discharge Instructions    Activity as tolerated - No restrictions   Complete by:  As directed    Diet - low sodium heart healthy   Complete by:  As directed    Continue healthy heart and diabetic diet.   Discharge instructions   Complete by:  As directed    Please follow-up with your PCP and cardiologist for further evaluation of your dizziness/lightheadedness. Changes we have made is reducing your gabapentin to 300 mg p.o. twice a day, vitamin B12 supplements as your B12 was low. Continue to ambulate with caution, fall precautions. Monitor your fluid intake, daily weight   Increase activity slowly   Complete by:  As directed        Medication List    STOP taking these medications   meloxicam 15 MG tablet Commonly known as:  MOBIC     TAKE these medications   acetaminophen 500 MG tablet Commonly known as:  TYLENOL Take 1,000 mg by mouth every 6 (six) hours as needed for mild pain.   allopurinol 300 MG tablet Commonly known as:  ZYLOPRIM Take 1.5 tablets (450 mg total) by mouth daily.   aspirin 81 MG tablet Take 81 mg by mouth at bedtime.   cetirizine 10 MG tablet Commonly known as:  ZYRTEC Take 1 tablet (10 mg total) by mouth daily.   colchicine 0.6 MG  tablet Take 1 tablet (0.6 mg total) by mouth daily.   diphenhydramine-acetaminophen 25-500 MG Tabs tablet Commonly known as:  TYLENOL PM Take 1 tablet by mouth at bedtime as needed.   fluticasone 50 MCG/ACT nasal spray Commonly known as:  FLONASE Place 2 sprays into both nostrils daily.   gabapentin 300 MG capsule Commonly known as:  NEURONTIN Take 1 capsule (300 mg total) by mouth 2 (two) times daily. What changed:  how much to take   glimepiride 1 MG tablet Commonly known as:  AMARYL TAKE 1 TABLET EVERY DAY WITH BREAKFAST   glucose blood test strip Commonly known as:  ONETOUCH VERIO Use one strips to test sugars 1-2 times daily. Dx. E11.9   hydrochlorothiazide 12.5 MG tablet Commonly known as:  HYDRODIURIL TAKE 1 TABLET DAILY (DOSE  CHANGE)   ICY HOT 7.5 % (Roll) Misc Generic drug:  Menthol (Topical Analgesic) Apply 1 each topically daily as needed.   meclizine 25 MG tablet Commonly known as:  ANTIVERT Take 1 tablet (25 mg total) by mouth 3 (three) times daily as needed for dizziness.   metFORMIN 500 MG tablet Commonly known as:  GLUCOPHAGE TAKE 1 TABLET 4 TIMES DAILY   multivitamin with minerals Tabs tablet Take 1 tablet by mouth daily.   ONE TOUCH DELICA LANCING DEV Misc Please use to test sugars once daily.   ONETOUCH DELICA LANCETS 79G Misc Please use to test sugars once daily. Dx. E11.9   ONETOUCH VERIO High Soln Use as directed.   ONETOUCH VERIO w/Device  Kit 1 each by Does not apply route daily. To test sugars. Dx. E11.9   pravastatin 40 MG tablet Commonly known as:  PRAVACHOL Take 1 tablet (40 mg total) by mouth daily.   quinapril 40 MG tablet Commonly known as:  ACCUPRIL Take 1 tablet (40 mg total) by mouth daily.   tamsulosin 0.4 MG Caps capsule Commonly known as:  FLOMAX take 1 capsule by mouth once daily 30 MINUTES FOLLOWING THE SAME MEAL EACH DAY   vitamin B-12 500 MCG tablet Commonly known as:  CYANOCOBALAMIN Take 1 tablet (500 mcg  total) by mouth daily. Start taking on:  07/10/2018   vitamin E 400 UNIT capsule Take 400 Units by mouth daily.   VOLTAREN 1 % Gel Generic drug:  diclofenac sodium Apply 2 g topically daily as needed (pain).       No Known Allergies    Procedures/Studies: Dg Chest 2 View  Result Date: 07/07/2018 CLINICAL DATA:  Dizziness, weakness. EXAM: CHEST - 2 VIEW COMPARISON:  Radiographs of February 16, 2018. FINDINGS: The heart size and mediastinal contours are within normal limits. Both lungs are clear. No pneumothorax or pleural effusion is noted. The visualized skeletal structures are unremarkable. IMPRESSION: No active cardiopulmonary disease. Electronically Signed   By: Marijo Conception, M.D.   On: 07/07/2018 17:47      Subjective: Patient was seen and examined 07/09/2018, 12:08 PM Patient stable  Today. No acute distress.  No issues overnight Stable for discharge.  Discharge Exam:  Vitals:   07/08/18 2045 07/08/18 2300 07/09/18 0500 07/09/18 0859  BP: (!) 143/93  135/86 (!) 148/83  Pulse: 70  62   Resp: 18  15   Temp: 98.5 F (36.9 C)  (!) 97.5 F (36.4 C)   TempSrc: Oral  Oral   SpO2: 97% 97% 97%   Weight:   120.9 kg (266 lb 9.6 oz)   Height:        General: Pt lying comfortably in bed & appears in no obvious distress. Cardiovascular: S1 & S2 heard, RRR, S1/S2 +. No murmurs, rubs, gallops or clicks. No JVD or pedal edema. Respiratory: Clear to auscultation without wheezing, rhonchi or crackles. No increased work of breathing. Abdominal:  Non distended, non tender & soft. No organomegaly or masses appreciated. Normal bowel sounds heard. CNS: Alert and oriented. No focal deficits. Extremities: no edema, no cyanosis    The results of significant diagnostics from this hospitalization (including imaging, microbiology, ancillary and laboratory) are listed below for reference.     Microbiology: No results found for this or any previous visit (from the past 240 hour(s)).    Labs: CBC: Recent Labs  Lab 07/07/18 1508 07/07/18 2039  WBC 7.2 6.2  HGB 14.5 14.3  HCT 44.0 42.8  MCV 105.5* 104.9*  PLT 234 825   Basic Metabolic Panel: Recent Labs  Lab 07/07/18 1508 07/07/18 1705 07/07/18 2039 07/08/18 0407 07/09/18 0713  NA 139  --   --   --   --   K 4.3  --   --   --   --   CL 102  --   --   --   --   CO2 24  --   --   --   --   GLUCOSE 174*  --   --  113* 133*  BUN 23  --   --   --   --   CREATININE 1.09  --  1.05  --   --  CALCIUM 9.2  --   --   --   --   MG  --  2.0  --   --   --    Liver Function Tests: Recent Labs  Lab 07/07/18 2039  AST 27  ALT 24  ALKPHOS 47  BILITOT 0.9  PROT 6.6  ALBUMIN 3.9   BNP (last 3 results) Recent Labs    07/07/18 1705  BNP 11.9   Cardiac Enzymes: Recent Labs  Lab 07/07/18 1705 07/07/18 2039  CKTOTAL  --  144  TROPONINI <0.03  --    CBG: Recent Labs  Lab 07/07/18 1705  GLUCAP 110*   Hgb A1c No results for input(s): HGBA1C in the last 72 hours. Lipid Profile No results for input(s): CHOL, HDL, LDLCALC, TRIG, CHOLHDL, LDLDIRECT in the last 72 hours. Thyroid function studies Recent Labs    07/07/18 1705  TSH 2.151   Anemia work up Recent Labs    07/07/18 2039  VITAMINB12 172*  RETICCTPCT 1.3   Urinalysis    Component Value Date/Time   COLORURINE YELLOW 05/20/2014 [place] 05/20/2014 1126   LABSPEC 1.021 05/20/2014 1126   PHURINE 6.0 05/20/2014 1126   GLUCOSEU >1000 (A) 05/20/2014 1126   HGBUR NEGATIVE 05/20/2014 1126   BILIRUBINUR NEGATIVE 05/20/2014 1126   KETONESUR NEGATIVE 05/20/2014 1126   PROTEINUR NEGATIVE 05/20/2014 1126   UROBILINOGEN 1.0 05/20/2014 1126   NITRITE NEGATIVE 05/20/2014 1126   LEUKOCYTESUR NEGATIVE 05/20/2014 1126    Time coordinating discharge: Over 30 minutes  SIGNED: Deatra James, MD, FACP, FHM. [facility],  Pager [phone]  If 7PM-7AM, please contact night-coverage [email or web address]  [facility] 07/09/2018, 12:08 PM

## 2018-07-10 ENCOUNTER — Telehealth: Payer: Self-pay

## 2018-07-10 ENCOUNTER — Telehealth: Payer: Self-pay | Admitting: General Practice

## 2018-07-10 LAB — FOLATE RBC
Folate, RBC: UNDETERMINED ng/mL
HEMATOCRIT: 40.4 % (ref 37.5–51.0)

## 2018-07-10 LAB — HCV AB W REFLEX TO QUANT PCR

## 2018-07-10 LAB — HCV INTERPRETATION

## 2018-07-10 NOTE — Telephone Encounter (Signed)
Ok to place referral?    Copied from [place] [id number]. Topic: Referral - Request >> Jul 10, 2018  2:53 PM Bea Graff, NT wrote: Reason for CRM: Pt would like a referral to a cardiologist for recent hospitalization for lightheaded, dizziness, and change in vision. He would like to referral to be sent to Dr. Daneen Schick III. Phone#: [phone]

## 2018-07-10 NOTE — Progress Notes (Signed)
Please call patient: I have reviewed his/her lab results. Hep c tests are negative

## 2018-07-10 NOTE — Telephone Encounter (Signed)
Discharge summary reviewed: please have pt make a follow up appointment with dr. Birdie Riddle or myself or cody for hospital f/u. We can then discuss cardiology referral.  Cardiology evaluation during hospitalization was reviewed.

## 2018-07-10 NOTE — Telephone Encounter (Signed)
FYI.  Routing message to [employer] as she has been trying to reach pt to complete TCM call.

## 2018-07-10 NOTE — Telephone Encounter (Signed)
Spoke with patient who states he is doing "okay". Phone call was disconnected. Attempted to call pt back, busy signal. Will continue to attempt to reach pt to complete TCM and schedule hospital f/u.

## 2018-07-11 ENCOUNTER — Other Ambulatory Visit: Payer: Self-pay | Admitting: Family Medicine

## 2018-07-11 DIAGNOSIS — C679 Malignant neoplasm of bladder, unspecified: Secondary | ICD-10-CM | POA: Diagnosis not present

## 2018-07-11 NOTE — Telephone Encounter (Signed)
Transition Care Management Follow-up Telephone Call  07/07/18-07/09/18 Dizziness, Palpitation, Near Syncope    How have you been since you were released from the hospital? "I'm good"   Do you understand why you were in the hospital? Yes   Do you understand the discharge instructions? yes   Where were you discharged to? Home. Lives with family.    Items Reviewed:  Medications reviewed: no, did not have meds at time of call, will bring to appt   Allergies reviewed: yes  Dietary changes reviewed: yes  Referrals reviewed: yes. Requesting cardiology referral for heart monitor.    Functional Questionnaire:   Activities of Daily Living (ADLs):   He states they are independent in the following: ambulation, bathing and hygiene, feeding, continence, grooming, toileting and dressing States they require assistance with the following: None   Any transportation issues/concerns?: no   Any patient concerns? no   Confirmed importance and date/time of follow-up visits scheduled yes  Provider Appointment booked with Dr. Jonni Sanger on Monday, 07/24/2018.   Confirmed with patient if condition begins to worsen call PCP or go to the ER.  Patient was given the office number and encouraged to call back with question or concerns.  : yes

## 2018-07-24 ENCOUNTER — Other Ambulatory Visit: Payer: Self-pay

## 2018-07-24 ENCOUNTER — Encounter: Payer: Self-pay | Admitting: Family Medicine

## 2018-07-24 ENCOUNTER — Ambulatory Visit (INDEPENDENT_AMBULATORY_CARE_PROVIDER_SITE_OTHER): Payer: Medicare HMO | Admitting: Family Medicine

## 2018-07-24 VITALS — BP 110/76 | HR 68 | Temp 98.4°F | Ht 67.0 in | Wt 274.2 lb

## 2018-07-24 DIAGNOSIS — E1140 Type 2 diabetes mellitus with diabetic neuropathy, unspecified: Secondary | ICD-10-CM

## 2018-07-24 DIAGNOSIS — E538 Deficiency of other specified B group vitamins: Secondary | ICD-10-CM

## 2018-07-24 DIAGNOSIS — R42 Dizziness and giddiness: Secondary | ICD-10-CM | POA: Diagnosis not present

## 2018-07-24 MED ORDER — GABAPENTIN 300 MG PO CAPS: 300.0000 mg | ORAL_CAPSULE | Freq: Two times a day (BID) | ORAL | Status: DC

## 2018-07-24 NOTE — Patient Instructions (Signed)
Please return in 4 - 6 weeks with Dr. Birdie Riddle for follow up on your symptoms.   I recommend stopping amaryl to see if this helps your symptoms. You can follow up with dr. Birdie Riddle to see if it needs to be restarted.   If you have any questions or concerns, please don't hesitate to send me a message via MyChart or call the office at [phone]. Thank you for visiting with [place] today! It's our pleasure caring for you.

## 2018-07-24 NOTE — Progress Notes (Signed)
Subjective  CC:  Chief Complaint  Patient presents with  . Hospitalization Follow-up    Discharged on 07/09/2018, Admitted for Dizzness, Palpatations, Near Syncope, States he still has some Lightheadness     HPI: John Bonilla is a 72 y.o. male who presents to the office today to address the problems listed above in the chief complaint. I've personally reviewed recent office visit notes, hospital notes, associated labs and imaging reports and/or pertinent outside office records via chart review or CareEverywhere. Briefly, admitted for further evaluation of intermittent presyncope; workup including labs, review of brain MRI and carotid dopplers, 2D echo were all normal. Pt has referral to neuro in place   recommendations: vit B12 deficiency wo anemia dxd: started oral supplements; decreased gabapentin dosage to see if contributor. Monitor sugars and bp; no orthostatic sxs identified in house.   Last a1c was 6.2 on amaryl and metformin  Pt reports: doing ok. Has had 2 "minor" episodes of light headedness since being out of the hospital. Has questions about cardiology referral: "they recommend a heart monitor".  Assessment  1. Dizziness   2. Lightheadedness   3. B12 deficiency   4. Type 2 diabetes mellitus with diabetic neuropathy, without long-term current use of insulin (HCC)      Plan   presyncope:  Thorough eval to date. Discussed heart monitor: [facility] and [facility] referral at this time.   Could be related to medications: I'm concerned about amaryl; on low dose but most recent a1c at 6.2; could be having lows or relative lows giving him sxs. Hold for next 4-6 weeks and then reassess sxs. Goal a1c about 6.5.   Also on flomax: alpha blocker could be contributing. Blood pressures are tightly controlled.   Has f/u with neuro.   Follow up: Return in about 4 weeks (around 08/21/2018) for recheck.   No orders of the defined types were placed in this  encounter.  Meds ordered this encounter  Medications  . gabapentin (NEURONTIN) 300 MG capsule    Sig: Take 1 capsule (300 mg total) by mouth 2 (two) times daily.      I reviewed the patients updated PMH, FH, and SocHx.    Patient Active Problem List   Diagnosis Date Noted  . B12 deficiency 07/08/2018  . Lightheadedness 07/07/2018  . OSA (obstructive sleep apnea) 07/07/2018  . Allergic rhinitis 02/28/2018  . Gout 05/18/2017  . Dizziness 10/07/2016  . Anxiety state 10/07/2016  . S/P cervical spinal fusion 11/26/2015  . Severe obesity (BMI >= 40) ([place]) 11/14/2015  . Physical exam 11/14/2015  . Right low back pain 04/29/2015  . Diabetes mellitus with neuropathy ([place]) 02/20/2015  . Hyperlipidemia associated with type 2 diabetes mellitus ([place]) 02/20/2015  . HTN (hypertension) 02/20/2015  . 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 acetaminophen (TYLENOL) 500 MG tablet Take 1,000 mg by mouth every 6 (six) hours as needed for mild pain.  Marland Kitchen allopurinol (ZYLOPRIM) 300 MG tablet Take 1.5 tablets (450 mg total) by mouth daily.  Marland Kitchen aspirin 81 MG tablet Take 81 mg by mouth at bedtime.   . Blood Glucose Calibration (ONETOUCH VERIO) High SOLN Use as directed.  . Blood Glucose Monitoring Suppl (ONETOUCH VERIO) w/Device KIT 1 each by Does not apply route daily. To test sugars. Dx. E11.9  . cetirizine (ZYRTEC) 10 MG tablet Take 1 tablet (10 mg total) by mouth daily.  Marland Kitchen  colchicine 0.6 MG tablet Take 1 tablet (0.6 mg total) by mouth daily.  . diclofenac sodium (VOLTAREN) 1 % GEL Apply 2 g topically daily as needed (pain).   Marland Kitchen diphenhydramine-acetaminophen (TYLENOL PM) 25-500 MG TABS tablet Take 1 tablet by mouth at bedtime as needed.  . fluticasone (FLONASE) 50 MCG/ACT nasal spray Place 2 sprays into both nostrils daily.  Marland Kitchen gabapentin (NEURONTIN) 300 MG capsule Take 1 capsule (300 mg total) by mouth 2 (two) times daily.  Marland Kitchen glimepiride (AMARYL) 1 MG tablet TAKE  1 TABLET EVERY DAY WITH BREAKFAST  . glucose blood (ONETOUCH VERIO) test strip Use one strips to test sugars 1-2 times daily. Dx. E11.9  . hydrochlorothiazide (HYDRODIURIL) 12.5 MG tablet TAKE 1 TABLET DAILY (DOSE  CHANGE)  . Lancet Devices (ONE TOUCH DELICA LANCING DEV) MISC Please use to test sugars once daily.  . meclizine (ANTIVERT) 25 MG tablet TAKE 1 TABLET(25 MG) BY MOUTH THREE TIMES DAILY AS NEEDED FOR DIZZINESS  . Menthol, Topical Analgesic, (ICY HOT) 7.5 % (Roll) MISC Apply 1 each topically daily as needed.  . metFORMIN (GLUCOPHAGE) 500 MG tablet TAKE 1 TABLET 4 TIMES DAILY  . Multiple Vitamin (MULTIVITAMIN WITH MINERALS) TABS tablet Take 1 tablet by mouth daily.  Glory Rosebush DELICA LANCETS 18A MISC Please use to test sugars once daily. Dx. E11.9  . pravastatin (PRAVACHOL) 40 MG tablet Take 1 tablet (40 mg total) by mouth daily.  . quinapril (ACCUPRIL) 40 MG tablet Take 1 tablet (40 mg total) by mouth daily.  . tamsulosin (FLOMAX) 0.4 MG CAPS capsule take 1 capsule by mouth once daily 30 MINUTES FOLLOWING THE SAME MEAL EACH DAY  . vitamin B-12 (CYANOCOBALAMIN) 500 MCG tablet Take 1 tablet (500 mcg total) by mouth daily.  . vitamin E 400 UNIT capsule Take 400 Units by mouth daily.  . [DISCONTINUED] gabapentin (NEURONTIN) 300 MG capsule Take 1 capsule (300 mg total) by mouth 2 (two) times daily. (Patient taking differently: Take 300 mg by mouth 2 (two) times daily. )    Allergies: Patient has No Known Allergies. Family History: Patient family history includes Cancer in his sister; Cancer (age of onset: 18) in his mother; Heart disease (age of onset: 28) in his father. Social History:  Patient  reports that he quit smoking about 29 years ago. He has never used smokeless tobacco. He reports that he drinks alcohol. He reports that he does not use drugs.  Review of Systems: Constitutional: Negative for fever malaise or anorexia Cardiovascular: negative for chest pain Respiratory:  negative for SOB or persistent cough Gastrointestinal: negative for abdominal pain BP Readings from Last 3 Encounters:  07/24/18 110/76  07/09/18 (!) 148/83  06/28/18 130/81    Objective  Vitals: BP 110/76   Pulse 68   Temp 98.4 F (36.9 C)   Ht _0  (1.702 m)   Wt 274 lb 3.2 oz (124.4 kg)   SpO2 94%   BMI 42.95 kg/m  General: no acute distress , A&Ox3, flat affect HEENT: PEERL, conjunctiva normal, Oropharynx moist,neck is supple Cardiovascular:  RRR with soft murmur no gallop.  Respiratory:  Good breath sounds bilaterally, CTAB with normal respiratory effort Skin:  Warm, no rashes  Lab Results  Component Value Date   HGBA1C 6.2 06/28/2018   HGBA1C 6.9 (H) 03/28/2018   HGBA1C 6.8 (H) 12/20/2017     No visits with results within 1 Day(s) from this visit.  Latest known visit with results is:  Admission on 07/07/2018, Discharged on 07/09/2018  Component Date Value Ref Range Status  . Sodium 07/07/2018 139  135 - 145 mmol/L Final  . Potassium 07/07/2018 4.3  3.5 - 5.1 mmol/L Final  . Chloride 07/07/2018 102  98 - 111 mmol/L Final  . CO2 07/07/2018 24  22 - 32 mmol/L Final  . Glucose, Bld 07/07/2018 174* 70 - 99 mg/dL Final  . BUN 07/07/2018 23  8 - 23 mg/dL Final  . Creatinine, Ser 07/07/2018 1.09  0.61 - 1.24 mg/dL Final  . Calcium 07/07/2018 9.2  8.9 - 10.3 mg/dL Final  . GFR calc non Af Amer 07/07/2018 >60  >60 mL/min Final  . GFR calc Af Amer 07/07/2018 >60  >60 mL/min Final  . Anion gap 07/07/2018 13  5 - 15 Final  . WBC 07/07/2018 7.2  4.0 - 10.5 K/uL Final  . RBC 07/07/2018 4.17* 4.22 - 5.81 MIL/uL Final  . Hemoglobin 07/07/2018 14.5  13.0 - 17.0 g/dL Final  . HCT 07/07/2018 44.0  39.0 - 52.0 % Final  . MCV 07/07/2018 105.5* 78.0 - 100.0 fL Final  . MCH 07/07/2018 34.8* 26.0 - 34.0 pg Final  . MCHC 07/07/2018 33.0  30.0 - 36.0 g/dL Final  . RDW 07/07/2018 13.4  11.5 - 15.5 % Final  . Platelets 07/07/2018 234  150 - 400 K/uL Final  . Glucose-Capillary  07/07/2018 110* 70 - 99 mg/dL Final  . Troponin I 07/07/2018 <0.03  <0.03 ng/mL Final  . Magnesium 07/07/2018 2.0  1.7 - 2.4 mg/dL Final  . B Natriuretic Peptide 07/07/2018 11.9  0.0 - 100.0 pg/mL Final  . WBC 07/07/2018 6.2  4.0 - 10.5 K/uL Final  . RBC 07/07/2018 4.08* 4.22 - 5.81 MIL/uL Final  . Hemoglobin 07/07/2018 14.3  13.0 - 17.0 g/dL Final  . HCT 07/07/2018 42.8  39.0 - 52.0 % Final  . MCV 07/07/2018 104.9* 78.0 - 100.0 fL Final  . MCH 07/07/2018 35.0* 26.0 - 34.0 pg Final  . MCHC 07/07/2018 33.4  30.0 - 36.0 g/dL Final  . RDW 07/07/2018 13.2  11.5 - 15.5 % Final  . Platelets 07/07/2018 223  150 - 400 K/uL Final  . Creatinine, Ser 07/07/2018 1.05  0.61 - 1.24 mg/dL Final  . GFR calc non Af Amer 07/07/2018 >60  >60 mL/min Final  . GFR calc Af Amer 07/07/2018 >60  >60 mL/min Final  . Total CK 07/07/2018 144  49 - 397 U/L Final  . TSH 07/07/2018 2.151  0.350 - 4.500 uIU/mL Final  . Vitamin B-12 07/07/2018 172* 180 - 914 pg/mL Final  . Folate, Hemolysate 07/07/2018 LA11  ng/mL Final  . Hematocrit 07/07/2018 40.4  37.5 - 51.0 % Final  . Folate, RBC 07/07/2018 UNABLE TO CALCULATE  ng/mL Final  . Weight 07/08/2018 4,278.41  oz Final-Edited  . BP 07/08/2018 123/82  mmHg Final-Edited  . Glucose, Bld 07/08/2018 113* 70 - 99 mg/dL Final  . Total Protein 07/07/2018 6.6  6.5 - 8.1 g/dL Final  . Albumin 07/07/2018 3.9  3.5 - 5.0 g/dL Final  . AST 07/07/2018 27  15 - 41 U/L Final  . ALT 07/07/2018 24  0 - 44 U/L Final  . Alkaline Phosphatase 07/07/2018 47  38 - 126 U/L Final  . Total Bilirubin 07/07/2018 0.9  0.3 - 1.2 mg/dL Final  . Bilirubin, Direct 07/07/2018 0.2  0.0 - 0.2 mg/dL Final  . Indirect Bilirubin 07/07/2018 0.7  0.3 - 0.9 mg/dL Final  . HCV Ab 07/08/2018 <0.1  0.0 - 0.9  s/co ratio Final  . Retic Ct Pct 07/07/2018 1.3  0.4 - 3.1 % Final  . RBC. 07/07/2018 4.08* 4.22 - 5.81 MIL/uL Final  . Retic Count, Absolute 07/07/2018 53.0  19.0 - 186.0 K/uL Final  . Glucose, Bld  07/09/2018 133* 70 - 99 mg/dL Final  . HCV Interp 1: 07/08/2018 Comment   Final      Commons side effects, risks, benefits, and alternatives for medications and treatment plan prescribed today were discussed, and the patient expressed understanding of the given instructions. Patient is instructed to call or message via MyChart if he/she has any questions or concerns regarding our treatment plan. No barriers to understanding were identified. We discussed Red Flag symptoms and signs in detail. Patient expressed understanding regarding what to do in case of urgent or emergency type symptoms.   Medication list was reconciled, printed and provided to the patient in AVS. Patient instructions and summary information was reviewed with the patient as documented in the AVS. This note was prepared with assistance of Dragon voice recognition software. Occasional wrong-word or sound-a-like substitutions may have occurred due to the inherent limitations of voice recognition software

## 2018-08-07 ENCOUNTER — Encounter: Payer: Self-pay | Admitting: Neurology

## 2018-08-07 ENCOUNTER — Ambulatory Visit: Payer: Medicare HMO | Admitting: Neurology

## 2018-08-07 VITALS — BP 149/93 | HR 68 | Ht 70.0 in | Wt 272.0 lb

## 2018-08-07 DIAGNOSIS — E538 Deficiency of other specified B group vitamins: Secondary | ICD-10-CM | POA: Diagnosis not present

## 2018-08-07 NOTE — Patient Instructions (Addendum)
Orthostatic Hypotension Orthostatic hypotension is a sudden drop in blood pressure that happens when you quickly change positions, such as when you get up from a seated or lying position. Blood pressure is a measurement of how strongly, or weakly, your blood is pressing against the walls of your arteries. Arteries are blood vessels that carry blood from your heart throughout your body. When blood pressure is too low, you may not get enough blood to your brain or to the rest of your organs. This can cause weakness, light-headedness, rapid heartbeat, and fainting. This can last for just a few seconds or for up to a few minutes. Orthostatic hypotension is usually not a serious problem. However, if it happens frequently or gets worse, it may be a sign of something more serious. What are the causes? This condition may be caused by:  Sudden changes in posture, such as standing up quickly after you have been sitting or lying down.  Blood loss.  Loss of body fluids (dehydration).  Heart problems.  Hormone (endocrine) problems.  Pregnancy.  Severe infection.  Lack of certain nutrients.  Severe allergic reactions (anaphylaxis).  Certain medicines, such as blood pressure medicine or medicines that make the body lose excess fluids (diuretics). Sometimes, this condition can be caused by not taking medicine as directed, such as taking too much of a certain medicine.  What increases the risk? Certain factors can make you more likely to develop orthostatic hypotension, including:  Age. Risk increases as you get older.  Conditions that affect the heart or the central nervous system.  Taking certain medicines, such as blood pressure medicine or diuretics.  Being pregnant.  What are the signs or symptoms? Symptoms of this condition may include:  Weakness.  Light-headedness.  Dizziness.  Blurred vision.  Fatigue.  Rapid heartbeat.  Fainting, in severe cases.  How is this  diagnosed? This condition is diagnosed based on:  Your medical history.  Your symptoms.  Your blood pressure measurement. Your health care provider will check your blood pressure when you are: ? Lying down. ? Sitting. ? Standing.  A blood pressure reading is recorded as two numbers, such as "120 over 80" (or 120/80). The first ("top") number is called the systolic pressure. It is a measure of the pressure in your arteries as your heart beats. The second ("bottom") number is called the diastolic pressure. It is a measure of the pressure in your arteries when your heart relaxes between beats. Blood pressure is measured in a unit called mm Hg. Healthy blood pressure for adults is 120/80. If your blood pressure is below 90/60, you may be diagnosed with hypotension. Other information or tests that may be used to diagnose orthostatic hypotension include:  Your other vital signs, such as your heart rate and temperature.  Blood tests.  Tilt table test. For this test, you will be safely secured to a table that moves you from a lying position to an upright position. Your heart rhythm and blood pressure will be monitored during the test.  How is this treated? Treatment for this condition may include:  Changing your diet. This may involve eating more salt (sodium) or drinking more water.  Taking medicines to raise your blood pressure.  Changing the dosage of certain medicines you are taking that might be lowering your blood pressure.  Wearing compression stockings. These stockings help to prevent blood clots and reduce swelling in your legs.  In some cases, you may need to go to the hospital for:    Fluid replacement. This means you will receive fluids through an IV tube.  Blood replacement. This means you will receive donated blood through an IV tube (transfusion).  Treating an infection or heart problems, if this applies.  Monitoring. You may need to be monitored while medicines that you  are taking wear off.  Follow these instructions at home: Eating and drinking   Drink enough fluid to keep your urine clear or pale yellow.  Eat a healthy diet and follow instructions from your health care provider about eating or drinking restrictions. A healthy diet includes: ? Fresh fruits and vegetables. ? Whole grains. ? Lean meats. ? Low-fat dairy products.  Eat extra salt only as directed. Do not add extra salt to your diet unless your health care provider told you to do that.  Eat frequent, small meals.  Avoid standing up suddenly after eating. Medicines  Take over-the-counter and prescription medicines only as told by your health care provider. ? Follow instructions from your health care provider about changing the dosage of your current medicines, if this applies. ? Do not stop or adjust any of your medicines on your own. General instructions  Wear compression stockings as told by your health care provider.  Get up slowly from lying down or sitting positions. This gives your blood pressure a chance to adjust.  Avoid hot showers and excessive heat as directed by your health care provider.  Return to your normal activities as told by your health care provider. Ask your health care provider what activities are safe for you.  Do not use any products that contain nicotine or tobacco, such as cigarettes and e-cigarettes. If you need help quitting, ask your health care provider.  Keep all follow-up visits as told by your health care provider. This is important. Contact a health care provider if:  You vomit.  You have diarrhea.  You have a fever for more than 2-3 days.  You feel more thirsty than usual.  You feel weak and tired. Get help right away if:  You have chest pain.  You have a fast or irregular heartbeat.  You develop numbness in any part of your body.  You cannot move your arms or your legs.  You have trouble speaking.  You become sweaty or feel  lightheaded.  You faint.  You feel short of breath.  You have trouble staying awake.  You feel confused. This information is not intended to replace advice given to you by your health care provider. Make sure you discuss any questions you have with your health care provider. Document Released: 11/19/2002 Document Revised: 08/17/2016 Document Reviewed: 05/21/2016 Elsevier Interactive Patient Education  2018 [employer].   Vitamin B12 Deficiency Vitamin B12 deficiency means that your body is not getting enough vitamin B12. Your body needs vitamin B12 for important bodily functions. If you do not have enough vitamin B12 in your body, you can have health problems. Follow these instructions at home:  Take supplements only as told by your doctor. Follow the directions carefully.  Get any shots (injections) as told by your doctor. Do not miss your visits to the doctor.  Eat lots of healthy foods that contain vitamin B12. Ask your doctor if you should work with someone who is trained in how food affects health (dietitian). Foods that contain vitamin B12 include: ? Meat. ? Meat from birds (poultry). ? Fish. ? Eggs. ? Cereal and dairy products that are fortified. This means that vitamin B12 has been added to  the food. Check the label on the package to see if the food is fortified.  Do not drink too much (do not abuse) alcohol.  Keep all follow-up visits as told by your doctor. This is important. Contact a doctor if:  Your symptoms come back. Get help right away if:  You have trouble breathing.  You have chest pain.  You get dizzy.  You pass out (lose consciousness). This information is not intended to replace advice given to you by your health care provider. Make sure you discuss any questions you have with your health care provider. Document Released: 11/18/2011 Document Revised: 05/06/2016 Document Reviewed: 04/16/2015 Elsevier Interactive Patient Education  [employer].

## 2018-08-07 NOTE — Progress Notes (Signed)
[facility]    Provider:  Dr Jaynee Eagles Referring Provider: Midge Minium, MD Primary Care Physician:  Midge Minium, MD  CC:  Dizziness  HPI:  John Bonilla is a 72 y.o. male here as requested by Dr. Birdie Riddle for dizziness. PMHx DM, HTN, Gout, Lightheadedness, OSA, B12 deficiency. A few months ago he starte having lightheadedness, dizziness, change in vision, lasted seconds. He has to stop what he is doing and "gather myself up". Lightheaded, unsteadiness once lasted for a few hours. Improved with stopping medication. Feeling much better. Only stopped the Amarys 2 weeks ago. He is improved significantly. Last episodes was a few days ago, changing positions standing up, lightheaded and dizzy. Discussed causes, asked him to pay attention to his urine, needs to be a clear light yellow to be hydrated. No falls. No focal symptoms. No other focal neurologic deficits, associated symptoms, inciting events or modifiable factors.  Reviewed notes, labs and imaging from outside physicians, which showed:  MRI brain images: normal for age, personally reviewed images  Reviewed hospital notes and discharge summary, presented with Dizziness, lightheadedness, nothing focal, no tremor. He was noted to have B12 deficiency, MRI brain was normal in June, negative carotid dopplers, echo with severe LVH and mild CHF,  orthostatics negative, On discharge 07/09/2018 they decreased Gabapentin and also stopped Glimepiride and meloxicam. H recently reduced Amarys.   Review of Systems: Patient complains of symptoms per HPI as well as the following symptoms: dizziness. Pertinent negatives and positives per HPI. All others negative.   Social History   Socioeconomic History  . Marital status: Married    Spouse name: Not on file  . Number of children: Not on file  . Years of education: Not on file  . Highest education level: Not on file  Occupational History  . Not on file  Social Needs    . Financial resource strain: Not on file  . Food insecurity:    Worry: Not on file    Inability: Not on file  . Transportation needs:    Medical: Not on file    Non-medical: Not on file  Tobacco Use  . Smoking status: Former Smoker    Last attempt to quit: 12/13/1988    Years since quitting: 29.6  . Smokeless tobacco: Never Used  Substance and Sexual Activity  . Alcohol use: Yes    Comment: RARELY   . Drug use: No  . Sexual activity: Not on file  Lifestyle  . Physical activity:    Days per week: Not on file    Minutes per session: Not on file  . Stress: Not on file  Relationships  . Social connections:    Talks on phone: Not on file    Gets together: Not on file    Attends religious service: Not on file    Active member of club or organization: Not on file    Attends meetings of clubs or organizations: Not on file    Relationship status: Not on file  . Intimate partner violence:    Fear of current or ex partner: Not on file    Emotionally abused: Not on file    Physically abused: Not on file    Forced sexual activity: Not on file  Other Topics Concern  . Not on file  Social History Narrative  . Not on file    Family History  Problem Relation Age of Onset  . Cancer Mother 45       liver/lung  .  Heart disease Father 34  . Cancer Sister        lung    Past Medical History:  Diagnosis Date  . Arthritis   . B12 deficiency   . Cancer (HCC)    BLADDER+ SKIN   . Diabetes mellitus without complication ([place])   . GERD (gastroesophageal reflux disease)    RESOLVED   . History of hiatal hernia   . History of kidney stones   . Hyperlipidemia   . Hypertension   . Morbid obesity ([place])   . Shortness of breath dyspnea    WITH EXERTION   . Sleep apnea    CPAP     Past Surgical History:  Procedure Laterality Date  . ANTERIOR CERVICAL DECOMP/DISCECTOMY FUSION N/A 11/26/2015   Procedure: C5-6 ANTERIOR CERVICAL DECOMPRESSION/DISCECTOMY FUSION ;  Surgeon: Eustace Moore, MD;  Location: [facility];  Service: Neurosurgery;  Laterality: N/A;  C5-6 ANTERIOR CERVICAL DECOMPRESSION/DISCECTOMY FUSION   . CATARACT EXTRACTION W/ INTRAOCULAR LENS  IMPLANT, BILATERAL    . CYSTOSCOPY  01/2015  . LAPAROSCOPIC GASTRIC BANDING      Current Outpatient Medications  Medication Sig Dispense Refill  . acetaminophen (TYLENOL) 500 MG tablet Take 1,000 mg by mouth every 6 (six) hours as needed for mild pain.    Marland Kitchen allopurinol (ZYLOPRIM) 300 MG tablet Take 1.5 tablets (450 mg total) by mouth daily. 135 tablet 1  . aspirin 81 MG tablet Take 81 mg by mouth at bedtime.     . Blood Glucose Calibration (ONETOUCH VERIO) High SOLN Use as directed. 1 each 3  . Blood Glucose Monitoring Suppl (ONETOUCH VERIO) w/Device KIT 1 each by Does not apply route daily. To test sugars. Dx. E11.9 1 kit 2  . cetirizine (ZYRTEC) 10 MG tablet Take 1 tablet (10 mg total) by mouth daily. 30 tablet 11  . colchicine 0.6 MG tablet Take 1 tablet (0.6 mg total) by mouth daily. 90 tablet 1  . diclofenac sodium (VOLTAREN) 1 % GEL Apply 2 g topically daily as needed (pain).     Marland Kitchen diphenhydramine-acetaminophen (TYLENOL PM) 25-500 MG TABS tablet Take 1 tablet by mouth at bedtime as needed.    . fluticasone (FLONASE) 50 MCG/ACT nasal spray Place 2 sprays into both nostrils daily. 16 g 6  . gabapentin (NEURONTIN) 300 MG capsule Take 1 capsule (300 mg total) by mouth 2 (two) times daily.    Marland Kitchen glucose blood (ONETOUCH VERIO) test strip Use one strips to test sugars 1-2 times daily. Dx. E11.9 100 each 12  . hydrochlorothiazide (HYDRODIURIL) 12.5 MG tablet TAKE 1 TABLET DAILY (DOSE  CHANGE) 90 tablet 1  . Lancet Devices (ONE TOUCH DELICA LANCING DEV) MISC Please use to test sugars once daily. 1 each 3  . meclizine (ANTIVERT) 25 MG tablet TAKE 1 TABLET(25 MG) BY MOUTH THREE TIMES DAILY AS NEEDED FOR DIZZINESS 30 tablet 0  . Menthol, Topical Analgesic, (ICY HOT) 7.5 % (Roll) MISC Apply 1 each topically daily as needed.      . metFORMIN (GLUCOPHAGE) 500 MG tablet TAKE 1 TABLET 4 TIMES DAILY 360 tablet 1  . Multiple Vitamin (MULTIVITAMIN WITH MINERALS) TABS tablet Take 1 tablet by mouth daily.    Glory Rosebush DELICA LANCETS 75T MISC Please use to test sugars once daily. Dx. E11.9 100 each 12  . pravastatin (PRAVACHOL) 40 MG tablet Take 1 tablet (40 mg total) by mouth daily. 90 tablet 1  . quinapril (ACCUPRIL) 40 MG tablet Take 1 tablet (40 mg  total) by mouth daily. 90 tablet 1  . tamsulosin (FLOMAX) 0.4 MG CAPS capsule take 1 capsule by mouth once daily 30 MINUTES FOLLOWING THE SAME MEAL EACH DAY  0  . vitamin E 400 UNIT capsule Take 400 Units by mouth daily.     No current facility-administered medications for this visit.     Allergies as of 08/07/2018  . (No Known Allergies)    Vitals: BP (!) 149/93 (Patient Position: Sitting)   Pulse 68   Ht '5\' 10"'$  (1.778 m)   Wt 272 lb (123.4 kg)   BMI 39.03 kg/m  Last Weight:  Wt Readings from Last 1 Encounters:  08/07/18 272 lb (123.4 kg)   Last Height:   Ht Readings from Last 1 Encounters:  08/07/18 '5\' 10"'$  (1.778 m)    Physical exam: Exam: Gen: NAD, conversant, well nourised, obese, well groomed                     CV: RRR, no MRG. No Carotid Bruits. No peripheral edema, warm, nontender Eyes: Conjunctivae clear without exudates or hemorrhage  Neuro: Detailed Neurologic Exam  Speech:    Speech is normal; fluent and spontaneous with normal comprehension.  Cognition:    The patient is oriented to person, place, and time;     recent and remote memory intact;     language fluent;     normal attention, concentration,     fund of knowledge Cranial Nerves:    The pupils are equal, round, and reactive to light. Attempted findoscopic exam could not viualize.  Visual fields are full to finger confrontation. Extraocular movements are intact. Trigeminal sensation is intact and the muscles of mastication are normal. The face is symmetric. The palate elevates in  the midline. Hearing intact. Voice is normal. Shoulder shrug is normal. The tongue has normal motion without fasciculations.   Coordination:    No dysmetria  Gait:    Wide-based and antalgic  Motor Observation:    No asymmetry, no atrophy, and no involuntary movements noted. Tone:    Normal muscle tone.    Posture:    Posture is normal. normal erect    Strength:    Strength is V/V in the upper and lower limbs.      Sensation: intact to LT     Reflex Exam:  DTR's:    Deep tendon reflexes in the Ajs and Biceps extremities are normal bilaterally.   Toes:    The toes are equiv bilaterally.   Clonus:    Clonus is absent.      Assessment/Plan:   72 y.o. male here as requested by Dr. Birdie Riddle for dizziness. PMHx DM, HTN, Gout, Lightheadedness, OSA, B12 deficiency. His symptoms are improved with a recent medication change so will monitor clinically. Discussed hydration, causes of his symptoms including orthostatic hypotension. Also discussed his B12 deficiency(b12 172) and will recheck his labs today. Discussed CPAP complicance and risks of untreated sleep apnea, discussed good diet and weight loss 9recommend [facility] at [facility])  Discussed: Non-Drug Treatment for Low Blood Pressure on Standing:  1. Changing Postures: . Change posture slowly when getting up, especially in the morning . Hold on to something during the first few minutes after standing up. Do not start walking as soon as you get up from the chair . Avoid prolonged recumbency or lying down . Raise the head of the bed by 10 to 20 degrees  2. Exercise: . Perform Isotonic  exercise, e.g.recumbent bike, pedaling movements while sitting in a chair . Avoid exercises where you have to strain   3. Avoid Pooling of blood in legs: . Wear custom-fitted elastic stockings. The ones which extend to the abdomen work even better. Consider wearing an abdominal binder. . Perform physical counter-maneuvers,  such as crossing legs and tensing leg muscles.  4. Eating and Drinking: . Small meals are recommended. Avoid large meals. Avoid standing suddenly after a large meal . Avoid alcohol . Increase intake of fluids and regular salt. A daily intake of up to 10 grams of sodium per day and a fluid intake of 2.0 to 2.5 liters per day (8 to 10 glasses of water) is recommended.  . Rapid (over 3 minutes) ingestion of approximately 0.5 liter (2 glasses of water) of tap water, raises blood pressure within 5 to 15 minutes and lasts for an hour.  5. Other Tips: . Avoid hot baths. Instead take warm baths . Maintain a BP record standing and lying down . If you are only any BP lowering drugs (antihypertensives, diuretics, antidepressants, drugs for prostate, etc) ask your doctor to revisit the need to keep you on these drugs . If all non-drug therapy fails, ask your doctor about drug therapy   Follow-up with primary care physician.     Orders Placed This Encounter  Procedures  . B12 and Folate Panel  . Methylmalonic acid, serum       Sarina Ill, MD  [facility] [address] [place], [place] [address]  Phone [phone] Fax [phone]

## 2018-08-10 LAB — METHYLMALONIC ACID, SERUM: METHYLMALONIC ACID: 161 nmol/L (ref 0–378)

## 2018-08-10 LAB — B12 AND FOLATE PANEL
Folate: >20 ng/mL (ref >3.0)
Vitamin B-12: 541 pg/mL (ref 232–1245)

## 2018-08-14 ENCOUNTER — Encounter: Payer: Self-pay | Admitting: Neurology

## 2018-08-22 ENCOUNTER — Encounter (HOSPITAL_COMMUNITY): Payer: Self-pay

## 2018-08-22 ENCOUNTER — Telehealth: Payer: Self-pay | Admitting: Emergency Medicine

## 2018-08-22 NOTE — Telephone Encounter (Signed)
He is on ASA daily and combined with Meloxicam increases risk of bleeding. He can continue his topical Voltaren gel. Make sure he is using as directed.  Would leave restarting to PCP. He may need to schedule appointment with her for reassessment.

## 2018-08-22 NOTE — Telephone Encounter (Signed)
Called patient and gave him the message from [place]. Patient has an appointment scheduled with Dr. Birdie Riddle on 08/31/18 at 10 am. Patient would like to go over medications with Dr. Birdie Riddle to see what he can have to help him with the pain he is having. Patient stated that he could wait until next week and wanted Dr. Birdie Riddle to know.

## 2018-08-22 NOTE — Telephone Encounter (Signed)
Please advise in PCP absence   Copied from [place] [phone]. Topic: General - Other >> Aug 22, 2018  3:08 PM Yvette Rack wrote: Reason for CRM: Pt states he was taken off of meloxicam (MOBIC) 15 MG tablet when he was in the hospital but he has recently began to have pain all over and he would like to know if it is ok for him to start back taking the medication. Cb# [phone]

## 2018-08-23 ENCOUNTER — Other Ambulatory Visit: Payer: Self-pay | Admitting: General Practice

## 2018-08-23 MED ORDER — ALLOPURINOL 300 MG PO TABS
450.0000 mg | ORAL_TABLET | Freq: Every day | ORAL | 1 refills | Status: DC
Start: 2018-08-23 — End: 2019-04-04

## 2018-08-24 ENCOUNTER — Other Ambulatory Visit: Payer: Self-pay | Admitting: General Practice

## 2018-08-24 MED ORDER — QUINAPRIL HCL 40 MG PO TABS: 40.0000 mg | ORAL_TABLET | Freq: Every day | ORAL | 1 refills | Status: DC

## 2018-08-31 ENCOUNTER — Ambulatory Visit (INDEPENDENT_AMBULATORY_CARE_PROVIDER_SITE_OTHER): Payer: Medicare HMO | Admitting: Family Medicine

## 2018-08-31 ENCOUNTER — Encounter: Payer: Self-pay | Admitting: Family Medicine

## 2018-08-31 ENCOUNTER — Other Ambulatory Visit: Payer: Self-pay

## 2018-08-31 VITALS — BP 131/80 | HR 76 | Temp 98.2°F | Resp 16 | Ht 70.0 in | Wt 272.1 lb

## 2018-08-31 DIAGNOSIS — R42 Dizziness and giddiness: Secondary | ICD-10-CM

## 2018-08-31 DIAGNOSIS — I10 Essential (primary) hypertension: Secondary | ICD-10-CM | POA: Diagnosis not present

## 2018-08-31 DIAGNOSIS — Z23 Encounter for immunization: Secondary | ICD-10-CM

## 2018-08-31 DIAGNOSIS — Z981 Arthrodesis status: Secondary | ICD-10-CM | POA: Diagnosis not present

## 2018-08-31 MED ORDER — MELOXICAM 15 MG PO TABS: 15.0000 mg | ORAL_TABLET | Freq: Every day | ORAL | 1 refills | Status: DC

## 2018-08-31 NOTE — Assessment & Plan Note (Signed)
Chronic problem.  Adequate control.  At times he is having orthostatic episodes.  Discussed wearing compression socks to limit the orthostatic events.  Will also stop the Flomax.  Reviewed supportive care and red flags that should prompt return.  Pt expressed understanding and is in agreement w/ plan.

## 2018-08-31 NOTE — Assessment & Plan Note (Signed)
Ongoing issue.  Some improvement since stopping the Amaryl.  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  Will stop Flomax as this can cause orthostasis.  Will continue to follow.

## 2018-08-31 NOTE — Patient Instructions (Signed)
Follow up next month as scheduled STOP the Tamulosin (Flomax) daily Drink plenty of fluids Wear compression socks to avoid the blood pooling in your legs- this will decrease your dizziness upon standing RESTART the Meloxicam once daily- take w/ food.  This will improve your pain Call with any questions or concerns [place]!!!

## 2018-08-31 NOTE — Progress Notes (Signed)
   Subjective:    Patient ID: John Bonilla, male    DOB: 1946/07/17, 72 y.o.   MRN: [id number]  HPI 'a whole bunch of things are going on right now'- pt is most likely moving to [place] in January.  Stopped Amaryl (glimiperide) and Meloxicam.  Since stopping Meloxicam, he is having a lot more pain- neck and back.  He believes stopping the Amaryl has helped w/ the dizziness- fewer episodes and less severe.  Pt has a log of 'episodes' of lightheadedness- 3 of them are related to orthostasis.  Pt is on Flomax.   Review of Systems For ROS see HPI     Objective:   Physical Exam  Constitutional: He is oriented to person, place, and time. He appears well-developed and well-nourished. No distress.  obese  HENT:  Head: Normocephalic and atraumatic.  Eyes: Pupils are equal, round, and reactive to light. Conjunctivae and EOM are normal.  Neck: Normal range of motion. Neck supple. No thyromegaly present.  Cardiovascular: Normal rate, regular rhythm, normal heart sounds and intact distal pulses.  No murmur heard. Pulmonary/Chest: Effort normal and breath sounds normal. No respiratory distress.  Abdominal: Soft. Bowel sounds are normal. He exhibits no distension.  Musculoskeletal: He exhibits no edema.  Lymphadenopathy:    He has no cervical adenopathy.  Neurological: He is alert and oriented to person, place, and time. No cranial nerve deficit. Coordination normal.  Skin: Skin is warm and dry.  Psychiatric: He has a normal mood and affect. His behavior is normal.  Vitals reviewed.         Assessment & Plan:

## 2018-08-31 NOTE — Assessment & Plan Note (Signed)
Ongoing issue.  Restart Meloxicam as pt's pain increased significantly after stopping this.

## 2018-09-04 ENCOUNTER — Ambulatory Visit: Payer: Medicare HMO | Admitting: Family Medicine

## 2018-09-04 ENCOUNTER — Telehealth: Payer: Self-pay | Admitting: Family Medicine

## 2018-09-04 MED ORDER — PRAVASTATIN SODIUM 40 MG PO TABS: 40.0000 mg | ORAL_TABLET | Freq: Every day | ORAL | 0 refills | Status: DC

## 2018-09-04 NOTE — Telephone Encounter (Unsigned)
Copied from [place] [id number]. Topic: General - Other >> Sep 04, 2018 10:11 AM Yvette Rack wrote: Reason for CRM: Pt states he is out of town and completely out of medication. Pt requests a Rx for a 21 day supply of pravastatin (PRAVACHOL) 40 MG tablets. Pt requests that the Rx be sent to [facility], [place]  [phone] (Phone)  [phone] (Fax)

## 2018-09-04 NOTE — Telephone Encounter (Unsigned)
Copied from [place] [phone]. Topic: Quick Communication - Rx Refill/Question >> Sep 04, 2018 10:07 AM Yvette Rack wrote: Medication: pravastatin (PRAVACHOL) 40 MG tablet  Has the patient contacted their pharmacy? no  Preferred Pharmacy (with phone number or street name): [facility] [place] [place] [facility] [phone] (Phone) [phone] (Fax)  Agent: Please be advised that RX refills may take up to 3 business days. We ask that you follow-up with your pharmacy.

## 2018-09-04 NOTE — Telephone Encounter (Signed)
Medication filled to pharmacy as requested.  Pt informed.   

## 2018-09-04 NOTE — Telephone Encounter (Signed)
See 2nd telephone encounter  

## 2018-09-05 ENCOUNTER — Telehealth: Payer: Self-pay | Admitting: General Practice

## 2018-09-05 NOTE — Telephone Encounter (Signed)
Have you received anything in regards to this?    Copied from [place] [phone]. Topic: Inquiry >> Sep 05, 2018 10:29 AM Conception Chancy, NT wrote: Reason for CRM: Frankey Poot is calling from [facility] and states he faxed over a 2 page paper for this patient that needs to be signed by Dr. Birdie Riddle. He would like a call back confirming the office received this.  Cb# [phone]

## 2018-09-05 NOTE — Telephone Encounter (Signed)
I have not yet seen anything on this

## 2018-09-05 NOTE — Telephone Encounter (Signed)
Paperwork received and given to PCP for completion and signature

## 2018-09-06 NOTE — Telephone Encounter (Signed)
Form completed to fax

## 2018-09-06 NOTE — Telephone Encounter (Signed)
Paperwork faxed today.

## 2018-09-14 DIAGNOSIS — M545 Low back pain: Secondary | ICD-10-CM | POA: Diagnosis not present

## 2018-10-02 ENCOUNTER — Encounter: Payer: Self-pay | Admitting: Family Medicine

## 2018-10-02 ENCOUNTER — Ambulatory Visit (INDEPENDENT_AMBULATORY_CARE_PROVIDER_SITE_OTHER): Payer: Medicare HMO | Admitting: Family Medicine

## 2018-10-02 ENCOUNTER — Other Ambulatory Visit: Payer: Self-pay

## 2018-10-02 VITALS — BP 130/82 | HR 68 | Temp 98.1°F | Resp 16 | Ht 70.0 in | Wt 276.1 lb

## 2018-10-02 DIAGNOSIS — N400 Enlarged prostate without lower urinary tract symptoms: Secondary | ICD-10-CM | POA: Insufficient documentation

## 2018-10-02 DIAGNOSIS — N401 Enlarged prostate with lower urinary tract symptoms: Secondary | ICD-10-CM

## 2018-10-02 DIAGNOSIS — R3914 Feeling of incomplete bladder emptying: Secondary | ICD-10-CM | POA: Diagnosis not present

## 2018-10-02 DIAGNOSIS — E1140 Type 2 diabetes mellitus with diabetic neuropathy, unspecified: Secondary | ICD-10-CM

## 2018-10-02 LAB — BASIC METABOLIC PANEL
BUN: 23 mg/dL (ref 6–23)
CALCIUM: 9.3 mg/dL (ref 8.4–10.5)
CO2: 30 meq/L (ref 19–32)
Chloride: 102 mEq/L (ref 96–112)
Creatinine, Ser: 0.83 mg/dL (ref 0.40–1.50)
GFR: 96.78 mL/min (ref >60.00)
Glucose, Bld: 169 mg/dL — ABNORMAL HIGH (ref 70–99)
Potassium: 4.2 mEq/L (ref 3.5–5.1)
Sodium: 141 mEq/L (ref 135–145)

## 2018-10-02 LAB — HEMOGLOBIN A1C: Hgb A1c MFr Bld: 6.8 % — ABNORMAL HIGH (ref 4.6–6.5)

## 2018-10-02 MED ORDER — TAMSULOSIN HCL 0.4 MG PO CAPS: ORAL_CAPSULE | ORAL | 1 refills | Status: AC

## 2018-10-02 NOTE — Assessment & Plan Note (Signed)
Chronic problem, hx of good control.  UTD on foot exam, eye exam.  On ACE for renal protection.  Stressed need for healthy diet and exercise as able.  Check labs.  Adjust meds prn.

## 2018-10-02 NOTE — Patient Instructions (Addendum)
Schedule your complete physical in 3-4 months (prior to moving) We'll notify you of your lab results and make any changes if needed Continue to work on healthy diet and regular exercise- you can do it! Call with any questions or concerns [place]!!!

## 2018-10-02 NOTE — Progress Notes (Signed)
   Subjective:    Patient ID: John Bonilla, male    DOB: 1946/09/22, 72 y.o.   MRN: [id number]  HPI DM- chronic problem, on Metformin 4x/day.  On ACE for renal protection, UTD on eye exam, foot exam.  Stopped Amaryl due to frequent dizziness.  Dizziness has almost completely resolved.  Denies CP, SOB, HAs, visual changes, abd pain, N/V.  No symptomatic lows.  BPH- stopped Flomax at last visit due to dizziness.  Since stopping medication, has had increased difficulty w/ urination.  Has sensation of incomplete emptying.   Review of Systems For ROS see HPI     Objective:   Physical Exam  Constitutional: He is oriented to person, place, and time. He appears well-developed and well-nourished. No distress.  obese  HENT:  Head: Normocephalic and atraumatic.  Eyes: Pupils are equal, round, and reactive to light. Conjunctivae and EOM are normal.  Neck: Normal range of motion. Neck supple. No thyromegaly present.  Cardiovascular: Normal rate, regular rhythm, normal heart sounds and intact distal pulses.  No murmur heard. Pulmonary/Chest: Effort normal and breath sounds normal. No respiratory distress.  Abdominal: Soft. Bowel sounds are normal. He exhibits no distension.  Musculoskeletal: He exhibits no edema.  Lymphadenopathy:    He has no cervical adenopathy.  Neurological: He is alert and oriented to person, place, and time. No cranial nerve deficit.  Skin: Skin is warm and dry.  Psychiatric: He has a normal mood and affect. His behavior is normal.  Vitals reviewed.         Assessment & Plan:

## 2018-10-02 NOTE — Assessment & Plan Note (Signed)
Restart Flomax as pt is having symptoms since stopping it.  Pt expressed understanding and is in agreement w/ plan.

## 2018-10-03 ENCOUNTER — Encounter: Payer: Self-pay | Admitting: General Practice

## 2018-10-09 ENCOUNTER — Other Ambulatory Visit: Payer: Self-pay | Admitting: Family Medicine

## 2018-10-09 ENCOUNTER — Other Ambulatory Visit: Payer: Self-pay

## 2018-10-09 MED ORDER — PRAVASTATIN SODIUM 40 MG PO TABS
40.0000 mg | ORAL_TABLET | Freq: Every day | ORAL | 0 refills | Status: DC
Start: 2018-10-09 — End: 2019-04-04

## 2018-10-09 NOTE — Telephone Encounter (Signed)
Copied from [place] [phone]. Topic: Quick Communication - Rx Refill/Question >> Oct 09, 2018  9:22 AM Marval Regal L wrote: Medication: pravastatin (PRAVACHOL) 40 MG tablet [[id number]] could we get a 10 day supply sent in to a local pharmacy [facility] [phone] [place], [place] [phone] (Phone)  [phone] (Fax)   gabapentin (NEURONTIN) 300 MG capsule metFORMIN (GLUCOPHAGE) 500 MG tablet   Has the patient contacted their pharmacy? No Preferred Pharmacy (with phone number or street name):[facility] [place], [place] [phone] (Phone) [phone] (Fax)   Agent: Please be advised that RX refills may take up to 3 business days. We ask that you follow-up with your pharmacy.

## 2018-10-10 DIAGNOSIS — C679 Malignant neoplasm of bladder, unspecified: Secondary | ICD-10-CM | POA: Diagnosis not present

## 2018-10-10 MED ORDER — GABAPENTIN 300 MG PO CAPS: 300.0000 mg | ORAL_CAPSULE | Freq: Two times a day (BID) | ORAL | 0 refills | Status: DC

## 2018-10-10 NOTE — Telephone Encounter (Signed)
Requested Prescriptions  Pending Prescriptions Disp Refills  . gabapentin (NEURONTIN) 300 MG capsule 180 capsule 0    Sig: Take 1 capsule (300 mg total) by mouth 2 (two) times daily.     Neurology: Anticonvulsants - gabapentin Passed - 10/09/2018 12:21 PM      Passed - Valid encounter within last 12 months    Recent Outpatient Visits          1 week ago Type 2 diabetes mellitus with diabetic neuropathy, without long-term current use of insulin [facility])   [place] [facility] [place] Midge Minium, MD   1 month ago [facility]	[place] [facility] [place] Midge Minium, MD   2 months ago [place] [facility] [place], MD   3 months ago Essential hypertension   [place] [facility] [place] Midge Minium, MD   4 months ago [place] [facility] [place], [place]      Future Appointments            In 1 month  [place], [place]   In 2 months Tabori, Aundra Millet, MD [place] [facility] [place], [place]         . metFORMIN (GLUCOPHAGE) 500 MG tablet 360 tablet 1     Endocrinology:  Diabetes - Biguanides Passed - 10/09/2018 12:21 PM      Passed - Cr in normal range and within 360 days    Creatinine, Ser  Date Value Ref Range Status  10/02/2018 0.83 0.40 - 1.50 mg/dL Final         Passed - HBA1C is between 0 and 7.9 and within 180 days    Hemoglobin A1C  Date Value Ref Range Status  09/09/2017 6.7  Final   Hgb A1c MFr Bld  Date Value Ref Range Status  10/02/2018 6.8 (H) 4.6 - 6.5 % Final    Comment:    Glycemic Control Guidelines for People with Diabetes:Non Diabetic:  <6%Goal of Therapy: <7%Additional Action Suggested:  >8%          Passed - eGFR in normal range and within 360 days    GFR calc Af Amer  Date Value Ref Range  Status  07/07/2018 >60 >60 mL/min Final    Comment:    (NOTE) The eGFR has been calculated using the CKD EPI equation. This calculation has not been validated in all clinical situations. eGFR's persistently <60 mL/min signify possible Chronic Kidney Disease. Performed at [facility], [place] [address], [place], [place] [address]    GFR calc non Af Wyvonnia Lora  Date Value Ref Range Status  07/07/2018 >60 >60 mL/min Final   GFR  Date Value Ref Range Status  10/02/2018 96.78 >60.00 mL/min Final         Passed - Valid encounter within last 6 months    Recent Outpatient Visits          1 week ago Type 2 diabetes mellitus with diabetic neuropathy, without long-term current use of insulin [facility])   [place] [employer] [employer] [facility] [place] Midge Minium, MD   1 month ago [facility]	[place] [facility] [place] Tabori, Aundra Millet, MD   2 months ago [place] [facility] [place], Karie Fetch, MD   3 months ago Essential hypertension   [place] [facility] [place] Midge Minium, MD   4 months ago  [place] Tabori, Aundra Millet, MD      Future Appointments            In 1 month  [place] [facility] [place], [place]   In 2 months Tabori, Aundra Millet, MD [place] [facility] [place], [place]

## 2018-10-30 DIAGNOSIS — R69 Illness, unspecified: Secondary | ICD-10-CM | POA: Diagnosis not present

## 2018-10-31 ENCOUNTER — Telehealth: Payer: Self-pay | Admitting: General Practice

## 2018-10-31 NOTE — Telephone Encounter (Signed)
This medication was stopped due to his dizziness.  No refills needed

## 2018-10-31 NOTE — Telephone Encounter (Signed)
Please advise. I received a refill request from [facility] for glimepiride. I do not see this on his active med list.

## 2018-10-31 NOTE — Telephone Encounter (Signed)
Noted  

## 2018-11-08 ENCOUNTER — Ambulatory Visit: Payer: Medicare HMO | Admitting: Family Medicine

## 2018-11-21 DIAGNOSIS — M1009 Idiopathic gout, multiple sites: Secondary | ICD-10-CM | POA: Diagnosis not present

## 2018-11-21 DIAGNOSIS — Z79899 Other long term (current) drug therapy: Secondary | ICD-10-CM | POA: Diagnosis not present

## 2018-11-21 DIAGNOSIS — E669 Obesity, unspecified: Secondary | ICD-10-CM | POA: Diagnosis not present

## 2018-11-21 DIAGNOSIS — M150 Primary generalized (osteo)arthritis: Secondary | ICD-10-CM | POA: Diagnosis not present

## 2018-11-21 DIAGNOSIS — M2550 Pain in unspecified joint: Secondary | ICD-10-CM | POA: Diagnosis not present

## 2018-11-21 DIAGNOSIS — Z6839 Body mass index (BMI) 39.0-39.9, adult: Secondary | ICD-10-CM | POA: Diagnosis not present

## 2018-11-21 DIAGNOSIS — M4802 Spinal stenosis, cervical region: Secondary | ICD-10-CM | POA: Diagnosis not present

## 2018-12-04 DIAGNOSIS — D225 Melanocytic nevi of trunk: Secondary | ICD-10-CM | POA: Diagnosis not present

## 2018-12-04 DIAGNOSIS — X32XXXD Exposure to sunlight, subsequent encounter: Secondary | ICD-10-CM | POA: Diagnosis not present

## 2018-12-04 DIAGNOSIS — L570 Actinic keratosis: Secondary | ICD-10-CM | POA: Diagnosis not present

## 2018-12-07 ENCOUNTER — Ambulatory Visit: Payer: Medicare HMO

## 2018-12-14 ENCOUNTER — Other Ambulatory Visit: Payer: Self-pay

## 2018-12-14 ENCOUNTER — Ambulatory Visit (INDEPENDENT_AMBULATORY_CARE_PROVIDER_SITE_OTHER): Payer: Medicare HMO

## 2018-12-14 VITALS — BP 142/82 | HR 73 | Ht 70.0 in | Wt 277.4 lb

## 2018-12-14 DIAGNOSIS — Z0000 Encounter for general adult medical examination without abnormal findings: Secondary | ICD-10-CM | POA: Diagnosis not present

## 2018-12-14 DIAGNOSIS — E669 Obesity, unspecified: Secondary | ICD-10-CM

## 2018-12-14 NOTE — Patient Instructions (Addendum)
Schedule eye exam.   Bring a copy of your living will and/or healthcare power of attorney to your next office visit.  Continue doing brain stimulating activities (puzzles, reading, adult coloring books, staying active) to keep memory sharp.   Health Maintenance, Male A healthy lifestyle and preventive care is important for your health and wellness. Ask your health care provider about what schedule of regular examinations is right for you. What should I know about weight and diet? Eat a Healthy Diet  Eat plenty of vegetables, fruits, whole grains, low-fat dairy products, and lean protein.  Do not eat a lot of foods high in solid fats, added sugars, or salt.  Maintain a Healthy Weight Regular exercise can help you achieve or maintain a healthy weight. You should:  Do at least 150 minutes of exercise each week. The exercise should increase your heart rate and make you sweat (moderate-intensity exercise).  Do strength-training exercises at least twice a week. Watch Your Levels of Cholesterol and Blood Lipids  Have your blood tested for lipids and cholesterol every 5 years starting at 73 years of age. If you are at high risk for heart disease, you should start having your blood tested when you are 73 years old. You may need to have your cholesterol levels checked more often if: ? Your lipid or cholesterol levels are high. ? You are older than 73 years of age. ? You are at high risk for heart disease. What should I know about cancer screening? Many types of cancers can be detected early and may often be prevented. Lung Cancer  You should be screened every year for lung cancer if: ? You are a current smoker who has smoked for at least 30 years. ? You are a former smoker who has quit within the past 15 years.  Talk to your health care provider about your screening options, when you should start screening, and how often you should be screened. Colorectal Cancer  Routine colorectal cancer  screening usually begins at 73 years of age and should be repeated every 5-10 years until you are 73 years old. You may need to be screened more often if early forms of precancerous polyps or small growths are found. Your health care provider may recommend screening at an earlier age if you have risk factors for colon cancer.  Your health care provider may recommend using home test kits to check for hidden blood in the stool.  A small camera at the end of a tube can be used to examine your colon (sigmoidoscopy or colonoscopy). This checks for the earliest forms of colorectal cancer. Prostate and Testicular Cancer  Depending on your age and overall health, your health care provider may do certain tests to screen for prostate and testicular cancer.  Talk to your health care provider about any symptoms or concerns you have about testicular or prostate cancer. Skin Cancer  Check your skin from head to toe regularly.  Tell your health care provider about any new moles or changes in moles, especially if: ? There is a change in a mole's size, shape, or color. ? You have a mole that is larger than a pencil eraser.  Always use sunscreen. Apply sunscreen liberally and repeat throughout the day.  Protect yourself by wearing long sleeves, pants, a wide-brimmed hat, and sunglasses when outside. What should I know about heart disease, diabetes, and high blood pressure?  If you are 69-17 years of age, have your blood pressure checked every 3-5 years.  If you are 71 years of age or older, have your blood pressure checked every year. You should have your blood pressure measured twice-once when you are at a hospital or clinic, and once when you are not at a hospital or clinic. Record the average of the two measurements. To check your blood pressure when you are not at a hospital or clinic, you can use: ? An automated blood pressure machine at a pharmacy. ? A home blood pressure monitor.  Talk to your health  care provider about your target blood pressure.  If you are between 26-34 years old, ask your health care provider if you should take aspirin to prevent heart disease.  Have regular diabetes screenings by checking your fasting blood sugar level. ? If you are at a normal weight and have a low risk for diabetes, have this test once every three years after the age of 2. ? If you are overweight and have a high risk for diabetes, consider being tested at a younger age or more often.  A one-time screening for abdominal aortic aneurysm (AAA) by ultrasound is recommended for men aged 48-75 years who are current or former smokers. What should I know about preventing infection? Hepatitis B If you have a higher risk for hepatitis B, you should be screened for this virus. Talk with your health care provider to find out if you are at risk for hepatitis B infection. Hepatitis C Blood testing is recommended for:  Everyone born from [birth year] through 1965.  Anyone with known risk factors for hepatitis C. Sexually Transmitted Diseases (STDs)  You should be screened each year for STDs including gonorrhea and chlamydia if: ? You are sexually active and are younger than 73 years of age. ? You are older than 73 years of age and your health care provider tells you that you are at risk for this type of infection. ? Your sexual activity has changed since you were last screened and you are at an increased risk for chlamydia or gonorrhea. Ask your health care provider if you are at risk.  Talk with your health care provider about whether you are at high risk of being infected with HIV. Your health care provider may recommend a prescription medicine to help prevent HIV infection. What else can I do?  Schedule regular health, dental, and eye exams.  Stay current with your vaccines (immunizations).  Do not use any tobacco products, such as cigarettes, chewing tobacco, and e-cigarettes. If you need help quitting, ask  your health care provider.  Limit alcohol intake to no more than 2 drinks per day. One drink equals 12 ounces of beer, 5 ounces of wine, or 1 ounces of hard liquor.  Do not use street drugs.  Do not share needles.  Ask your health care provider for help if you need support or information about quitting drugs.  Tell your health care provider if you often feel depressed.  Tell your health care provider if you have ever been abused or do not feel safe at home. This information is not intended to replace advice given to you by your health care provider. Make sure you discuss any questions you have with your health care provider. Document Released: 05/27/2008 Document Revised: 07/28/2016 Document Reviewed: 09/02/2015 Elsevier Interactive Patient Education  2019 [employer].

## 2018-12-14 NOTE — Progress Notes (Addendum)
Subjective:   John Bonilla is a 73 y.o. male who presents for Medicare Annual/Subsequent preventive examination.  Review of Systems:  No ROS.  Medicare Wellness Visit. Additional risk factors are reflected in the social history.  Cardiac Risk Factors include: advanced age (>36mn, >>15women);diabetes mellitus;dyslipidemia;male gender;hypertension;obesity (BMI >30kg/m2);sedentary lifestyle;family history of premature cardiovascular disease Sleep patterns: Sleeps 5 hours, does not feel rested (without med). When taking Tylenol PM, sleeps 7-8 hours. Uses CPAP.  Home Safety/Smoke Alarms: Feels safe in home. Smoke alarms in place.  Living environment; residence and Firearm Safety: Lives with wife in 1 story home. Rail at steps.  Seat Belt Safety/Bike Helmet: Wears seat belt.   Male:   CCS-Colonoscopy 12/22/2012, pt reports normal.       PSA- No results found for: PSA     Objective:    Vitals: BP (!) 146/86 (BP Location: Left Arm, Patient Position: Sitting, Cuff Size: Normal)   Pulse 73   Ht '5\' 10"'  (1.778 m)   Wt 277 lb 6 oz (125.8 kg)   SpO2 96%   BMI 39.80 kg/m   Body mass index is 39.8 kg/m.  Advanced Directives 12/14/2018 07/08/2018 07/07/2018 11/30/2017 11/19/2015  Does Patient Have a Medical Advance Directive? Yes No No Yes No  Type of Advance Directive Living will;Healthcare Power of AFreemanLiving will -  Copy of [place]way[place] Chart? No - copy requested - - No - copy requested -  Would patient like information on creating a medical advance directive? - No - Patient declined No - Patient declined - Yes - Educational materials given    Tobacco Social History   Tobacco Use  Smoking Status Former Smoker  . Last attempt to quit: 12/13/1988  . Years since quitting: 30.0  Smokeless Tobacco Never Used     Counseling given: Not Answered    Past Medical History:  Diagnosis Date  . Arthritis   . B12 deficiency   . Cancer  (HCC)    BLADDER+ SKIN   . Diabetes mellitus without complication ([facility]   . GERD (gastroesophageal reflux disease)    RESOLVED   . History of hiatal hernia   . History of kidney stones   . Hyperlipidemia   . Hypertension   . Morbid obesity ([place]   . Shortness of breath dyspnea    WITH EXERTION   . Sleep apnea    CPAP    Past Surgical History:  Procedure Laterality Date  . ANTERIOR CERVICAL DECOMP/DISCECTOMY FUSION N/A 11/26/2015   Procedure: C5-6 ANTERIOR CERVICAL DECOMPRESSION/DISCECTOMY FUSION ;  Surgeon: DEustace Moore MD;  [facility];  Service: Neurosurgery;  Laterality: N/A;  C5-6 ANTERIOR CERVICAL DECOMPRESSION/DISCECTOMY FUSION   . CATARACT EXTRACTION W/ INTRAOCULAR LENS  IMPLANT, BILATERAL    . CYSTOSCOPY  01/2015  . LAPAROSCOPIC GASTRIC BANDING     Family History  Problem Relation Age of Onset  . Cancer Mother 429      liver/lung  . Heart disease Father 417 . Cancer Sister        lung   Social History   Socioeconomic History  . Marital status: Married    Spouse name: Not on file  . Number of children: Not on file  . Years of education: Not on file  . Highest education level: Not on file  Occupational History  . Not on file  Social Needs  . Financial resource strain: Not on file  .  Food insecurity:    Worry: Not on file    Inability: Not on file  . Transportation needs:    Medical: Not on file    Non-medical: Not on file  Tobacco Use  . Smoking status: Former Smoker    Last attempt to quit: 12/13/1988    Years since quitting: 30.0  . Smokeless tobacco: Never Used  Substance and Sexual Activity  . Alcohol use: Yes    Comment: RARELY   . Drug use: No  . Sexual activity: Not on file  Lifestyle  . Physical activity:    Days per week: Not on file    Minutes per session: Not on file  . Stress: Not on file  Relationships  . Social connections:    Talks on phone: Not on file    Gets together: Not on file    Attends religious service: Not on  file    Active member of club or organization: Not on file    Attends meetings of clubs or organizations: Not on file    Relationship status: Not on file  Other Topics Concern  . Not on file  Social History Narrative  . Not on file    Outpatient Encounter Medications as of 12/14/2018  Medication Sig  . acetaminophen (TYLENOL) 500 MG tablet Take 1,000 mg by mouth every 6 (six) hours as needed for mild pain.  Marland Kitchen allopurinol (ZYLOPRIM) 300 MG tablet Take 1.5 tablets (450 mg total) by mouth daily.  . Blood Glucose Calibration (ONETOUCH VERIO) High SOLN Use as directed.  . Blood Glucose Monitoring Suppl (ONETOUCH VERIO) w/Device KIT 1 each by Does not apply route daily. To test sugars. Dx. E11.9  . cetirizine (ZYRTEC) 10 MG tablet Take 1 tablet (10 mg total) by mouth daily.  . colchicine 0.6 MG tablet Take 1 tablet (0.6 mg total) by mouth daily.  . Cyanocobalamin (VITAMIN B 12 PO) Take by mouth.  . diclofenac sodium (VOLTAREN) 1 % GEL Apply 2 g topically daily as needed (pain).   Marland Kitchen diphenhydramine-acetaminophen (TYLENOL PM) 25-500 MG TABS tablet Take 1 tablet by mouth at bedtime as needed.  . gabapentin (NEURONTIN) 300 MG capsule Take 1 capsule (300 mg total) by mouth 2 (two) times daily.  Marland Kitchen glucose blood (ONETOUCH VERIO) test strip Use one strips to test sugars 1-2 times daily. Dx. E11.9  . hydrochlorothiazide (HYDRODIURIL) 12.5 MG tablet TAKE 1 TABLET DAILY (DOSE  CHANGE)  . Lancet Devices (ONE TOUCH DELICA LANCING DEV) MISC Please use to test sugars once daily.  . meclizine (ANTIVERT) 25 MG tablet TAKE 1 TABLET(25 MG) BY MOUTH THREE TIMES DAILY AS NEEDED FOR DIZZINESS  . Menthol, Topical Analgesic, (ICY HOT) 7.5 % (Roll) MISC Apply 1 each topically daily as needed.  . metFORMIN (GLUCOPHAGE) 500 MG tablet TAKE 1 TABLET 4 TIMES DAILY  . Multiple Vitamin (MULTIVITAMIN WITH MINERALS) TABS tablet Take 1 tablet by mouth daily.  Glory Rosebush DELICA LANCETS 81E MISC Please use to test sugars once  daily. Dx. E11.9  . pravastatin (PRAVACHOL) 40 MG tablet Take 1 tablet (40 mg total) by mouth daily.  . quinapril (ACCUPRIL) 40 MG tablet Take 1 tablet (40 mg total) by mouth daily.  . vitamin E 400 UNIT capsule Take 400 Units by mouth daily.  Marland Kitchen aspirin 81 MG tablet Take 81 mg by mouth at bedtime.   . fluticasone (FLONASE) 50 MCG/ACT nasal spray Place 2 sprays into both nostrils daily. (Patient not taking: Reported on 12/14/2018)  . meloxicam (  MOBIC) 15 MG tablet TAKE 1 TABLET DAILY (Patient not taking: Reported on 12/14/2018)  . tamsulosin (FLOMAX) 0.4 MG CAPS capsule take 1 capsule by mouth once daily 30 MINUTES FOLLOWING THE SAME MEAL EACH DAY (Patient not taking: Reported on 12/14/2018)   No facility-administered encounter medications on file as of 12/14/2018.     Activities of Daily Living In your present state of health, do you have any difficulty performing the following activities: 12/14/2018 10/02/2018  Hearing? N N  Vision? N N  Difficulty concentrating or making decisions? N N  Walking or climbing stairs? N N  Dressing or bathing? N N  Doing errands, shopping? N N  Preparing Food and eating ? N -  Using the Toilet? N -  In the past six months, have you accidently leaked urine? N -  Do you have problems with loss of bowel control? N -  Managing your Medications? N -  Managing your Finances? N -  Housekeeping or managing your Housekeeping? N -  Some recent data might be hidden    Patient Care Team: Midge Minium, MD as PCP - General (Family Medicine) Almedia Balls, MD as Consulting Physician (Orthopedic Surgery) Gavin Pound, MD as Consulting Physician (Rheumatology) Puschinsky, Fransico Him., MD as Consulting Physician (Urology) Levy Sjogren, MD as Referring Physician (Dermatology)   Assessment:   This is a routine wellness examination for [place].  Exercise Activities and Dietary recommendations Current Exercise Habits: The patient does not participate in  regular exercise at present(yard work), Exercise limited by: None identified Diet (meal preparation, eat out, water intake, caffeinated beverages, dairy products, fruits and vegetables):   Breakfast: egg, sausage, potatoes, coffee. Oatmeal (instant) Lunch: fast food (2/week); fried meat, potatoes, vegetables Dinner: meat, potato  Goals    . Weight (lb) < 250 lb (113.4 kg)     Lose weight by watching diet.     . Weight (lb) < 265 lb (120.2 kg)     Lose weight by food/portion control.        Fall Risk Fall Risk  12/14/2018 10/02/2018 06/28/2018 12/20/2017 11/30/2017  Falls in the past year? 0 No No No No    Depression Screen PHQ 2/9 Scores 12/14/2018 10/02/2018 06/28/2018 12/20/2017  PHQ - 2 Score 0 0 0 0  PHQ- 9 Score - 0 0 0    Cognitive Function MMSE - Mini Mental State Exam 12/14/2018 11/30/2017  Orientation to time 5 5  Orientation to Place 5 5  Registration 3 3  Attention/ Calculation 5 5  Recall 3 2  Language- name 2 objects 2 2  Language- repeat 1 1  Language- follow 3 step command 3 3  Language- read & follow direction 1 1  Write a sentence 1 1  Copy design 1 1  Total score 30 29        Immunization History  Administered Date(s) Administered  . Influenza, High Dose Seasonal PF 08/31/2018  . Influenza,inj,Quad PF,6+ Mos 08/13/2014, 11/14/2015, 10/07/2016  . Influenza-Unspecified 09/12/2017  . Pneumococcal Conjugate-13 11/14/2015  . Pneumococcal Polysaccharide-23 08/13/2014  . Tdap 02/19/2010  . Zoster 02/20/2012    Screening Tests Health Maintenance  Topic Date Due  . OPHTHALMOLOGY EXAM  12/29/2018  . FOOT EXAM  03/29/2019  . HEMOGLOBIN A1C  04/03/2019  . TETANUS/TDAP  02/20/2020  . COLONOSCOPY  12/22/2022  . INFLUENZA VACCINE  Completed  . Hepatitis C Screening  Completed  . PNA vac Low Risk Adult  Completed  Plan:    Schedule eye exam.   Bring a copy of your living will and/or healthcare power of attorney to your next office  visit.  Continue doing brain stimulating activities (puzzles, reading, adult coloring books, staying active) to keep memory sharp.   I have personally reviewed and noted the following in the patient's chart:   . Medical and social history . Use of alcohol, tobacco or illicit drugs  . Current medications and supplements . Functional ability and status . Nutritional status . Physical activity . Advanced directives . List of other physicians . Hospitalizations, surgeries, and ER visits in previous 12 months . Vitals . Screenings to include cognitive, depression, and falls . Referrals and appointments  In addition, I have reviewed and discussed with patient certain preventive protocols, quality metrics, and best practice recommendations. A written personalized care plan for preventive services as well as general preventive health recommendations were provided to patient.     Gerilyn Nestle, RN  12/14/2018  PCP Notes: -Would like to discuss restarting Meloxicam and right knee pain.  -Pt still plans to move soon to [place]  -F/U with PCP 12/22/2018.   Reviewed documentation provided by RN and agree w/ above.  Annye Asa, MD

## 2018-12-22 ENCOUNTER — Other Ambulatory Visit: Payer: Self-pay

## 2018-12-22 ENCOUNTER — Ambulatory Visit (INDEPENDENT_AMBULATORY_CARE_PROVIDER_SITE_OTHER): Payer: Medicare HMO | Admitting: Family Medicine

## 2018-12-22 ENCOUNTER — Encounter: Payer: Self-pay | Admitting: Family Medicine

## 2018-12-22 VITALS — BP 123/81 | HR 60 | Temp 98.9°F | Resp 16 | Ht 70.0 in | Wt 276.5 lb

## 2018-12-22 DIAGNOSIS — E1140 Type 2 diabetes mellitus with diabetic neuropathy, unspecified: Secondary | ICD-10-CM | POA: Diagnosis not present

## 2018-12-22 DIAGNOSIS — E1169 Type 2 diabetes mellitus with other specified complication: Secondary | ICD-10-CM | POA: Diagnosis not present

## 2018-12-22 DIAGNOSIS — Z0000 Encounter for general adult medical examination without abnormal findings: Secondary | ICD-10-CM | POA: Diagnosis not present

## 2018-12-22 DIAGNOSIS — E785 Hyperlipidemia, unspecified: Secondary | ICD-10-CM

## 2018-12-22 LAB — CBC WITH DIFFERENTIAL/PLATELET
BASOS PCT: 0.9 % (ref 0.0–3.0)
Basophils Absolute: 0.1 10*3/uL (ref 0.0–0.1)
EOS ABS: 0.2 10*3/uL (ref 0.0–0.7)
EOS PCT: 3.1 % (ref 0.0–5.0)
HCT: 42.8 % (ref 39.0–52.0)
Hemoglobin: 14.6 g/dL (ref 13.0–17.0)
Lymphocytes Relative: 24.9 % (ref 12.0–46.0)
Lymphs Abs: 1.4 10*3/uL (ref 0.7–4.0)
MCHC: 34.1 g/dL (ref 30.0–36.0)
MCV: 104.4 fl — ABNORMAL HIGH (ref 78.0–100.0)
MONO ABS: 0.6 10*3/uL (ref 0.1–1.0)
Monocytes Relative: 10.2 % (ref 3.0–12.0)
Neutro Abs: 3.5 10*3/uL (ref 1.4–7.7)
Neutrophils Relative %: 60.9 % (ref 43.0–77.0)
Platelets: 253 10*3/uL (ref 150.0–400.0)
RBC: 4.1 Mil/uL — ABNORMAL LOW (ref 4.22–5.81)
RDW: 14.3 % (ref 11.5–15.5)
WBC: 5.8 10*3/uL (ref 4.0–10.5)

## 2018-12-22 LAB — HEMOGLOBIN A1C: HEMOGLOBIN A1C: 7.2 % — AB (ref 4.6–6.5)

## 2018-12-22 LAB — LIPID PANEL
CHOL/HDL RATIO: 3
CHOLESTEROL: 137 mg/dL (ref 0–200)
HDL: 45.4 mg/dL (ref >39.00)
LDL Cholesterol: 67 mg/dL (ref 0–99)
NonHDL: 91.28
TRIGLYCERIDES: 122 mg/dL (ref 0.0–149.0)
VLDL: 24.4 mg/dL (ref 0.0–40.0)

## 2018-12-22 LAB — HEPATIC FUNCTION PANEL
ALBUMIN: 4.4 g/dL (ref 3.5–5.2)
ALK PHOS: 50 U/L (ref 39–117)
ALT: 24 U/L (ref 0–53)
AST: 21 U/L (ref 0–37)
Bilirubin, Direct: 0.1 mg/dL (ref 0.0–0.3)
Total Bilirubin: 0.7 mg/dL (ref 0.2–1.2)
Total Protein: 6.7 g/dL (ref 6.0–8.3)

## 2018-12-22 LAB — BASIC METABOLIC PANEL
BUN: 16 mg/dL (ref 6–23)
CO2: 29 meq/L (ref 19–32)
CREATININE: 0.82 mg/dL (ref 0.40–1.50)
Calcium: 9.6 mg/dL (ref 8.4–10.5)
Chloride: 100 mEq/L (ref 96–112)
GFR: 98.08 mL/min (ref >60.00)
Glucose, Bld: 147 mg/dL — ABNORMAL HIGH (ref 70–99)
Potassium: 4 mEq/L (ref 3.5–5.1)
Sodium: 138 mEq/L (ref 135–145)

## 2018-12-22 LAB — TSH: TSH: 1.57 u[IU]/mL (ref 0.35–4.50)

## 2018-12-22 MED ORDER — MELOXICAM 15 MG PO TABS: 15.0000 mg | ORAL_TABLET | Freq: Every day | ORAL | 1 refills | Status: DC

## 2018-12-22 NOTE — Progress Notes (Signed)
   Subjective:    Patient ID: John Bonilla, male    DOB: March 09, 1946, 73 y.o.   MRN: [id number]  HPI CPE- UTD on urology, eye exam, foot exam.  UTD on colonoscopy, immunizations.   Review of Systems Patient reports no vision/hearing changes, anorexia, fever ,adenopathy, persistant/recurrent hoarseness, swallowing issues, chest pain, palpitations, edema, persistant/recurrent cough, hemoptysis, dyspnea (rest,exertional, paroxysmal nocturnal), gastrointestinal  bleeding (melena, rectal bleeding), abdominal pain, excessive heart burn, GU symptoms (dysuria, hematuria, voiding/incontinence issues) syncope, focal weakness, memory loss, numbness & tingling, skin/hair/nail changes, depression, anxiety, abnormal bruising/bleeding.   + knee pain/LBP- pt is moving so he is packing and doing a lot of heavy lifting.    Objective:   Physical Exam General Appearance:    Alert, cooperative, no distress, appears stated age, obese  Head:    Normocephalic, without obvious abnormality, atraumatic  Eyes:    PERRL, conjunctiva/corneas clear, EOM's intact, fundi    benign, both eyes       Ears:    Normal TM's and external ear canals, both ears  Nose:   Nares normal, septum midline, mucosa normal, no drainage   or sinus tenderness  Throat:   Lips, mucosa, and tongue normal; teeth and gums normal  Neck:   Supple, symmetrical, trachea midline, no adenopathy;       thyroid:  No enlargement/tenderness/nodules  Back:     Symmetric, no curvature, ROM normal, no CVA tenderness  Lungs:     Clear to auscultation bilaterally, respirations unlabored  Chest wall:    No tenderness or deformity  Heart:    Regular rate and rhythm, S1 and S2 normal, no murmur, rub   or gallop  Abdomen:     Soft, non-tender, bowel sounds active all four quadrants,    no masses, no organomegaly  Genitalia:    Deferred to urology  Rectal:    Extremities:   Extremities normal, atraumatic, no cyanosis or edema  Pulses:   2+ and symmetric all  extremities  Skin:   Skin color, texture, turgor normal, no rashes or lesions  Lymph nodes:   Cervical, supraclavicular, and axillary nodes normal  Neurologic:   CNII-XII intact. Normal strength, sensation and reflexes      throughout          Assessment & Plan:

## 2018-12-22 NOTE — Patient Instructions (Addendum)
Follow up in 3-4 months to recheck diabetes We'll notify you of your lab results and make any changes if needed Continue to work on healthy diet and regular exercise- you are doing great! Your eye exam is due later this month- please schedule Restart the Meloxicam as needed for joint pains Call with any questions or concerns Happy New Year!!!

## 2018-12-22 NOTE — Assessment & Plan Note (Signed)
Pt's PE unchanged from previous.  Remains obese.  UTD on colonoscopy, urology, immunizations.  Check labs.  Anticipatory guidance provided.

## 2018-12-22 NOTE — Assessment & Plan Note (Signed)
Chronic problem.  Tolerating statin w/o difficulty. 

## 2018-12-22 NOTE — Assessment & Plan Note (Signed)
Ongoing issue for pt.  He is working on weight loss.  Applauded his efforts.  His BMI of 39.67 along w/ DM, HTN, Hyperlipidemia qualify him as morbidly obese.  Will continue to follow.

## 2018-12-22 NOTE — Assessment & Plan Note (Signed)
Chronic problem.  UTD on eye exam, foot exam, on ACE for renal protection.  Check labs.  Adjust meds prn

## 2018-12-25 ENCOUNTER — Encounter: Payer: Self-pay | Admitting: General Practice

## 2019-01-19 DIAGNOSIS — N35912 Unspecified bulbous urethral stricture, male: Secondary | ICD-10-CM | POA: Diagnosis not present

## 2019-01-29 ENCOUNTER — Telehealth: Payer: Self-pay | Admitting: Family Medicine

## 2019-01-29 MED ORDER — HYDROCHLOROTHIAZIDE 12.5 MG PO TABS: ORAL_TABLET | ORAL | 1 refills | Status: DC

## 2019-01-29 MED ORDER — METFORMIN HCL 500 MG PO TABS: ORAL_TABLET | ORAL | 1 refills | Status: DC

## 2019-01-29 NOTE — Telephone Encounter (Signed)
Pt would like a 30 day supply of the Metformin and the Hydrochlorothiazide sent to the [facility] on [address].   With him moving he would also like a 90 day supply to be sent to the mailorder.   Pt can be reached at the home # for any questions or concerns.

## 2019-01-29 NOTE — Addendum Note (Signed)
Addended by: Davis Gourd on: 01/29/2019 03:58 PM   Modules accepted: Orders

## 2019-01-29 NOTE — Telephone Encounter (Signed)
Medication filled to pharmacy as requested.  Pt informed.   

## 2019-04-04 ENCOUNTER — Other Ambulatory Visit: Payer: Self-pay | Admitting: Family Medicine

## 2019-04-04 ENCOUNTER — Telehealth: Payer: Self-pay | Admitting: Family Medicine

## 2019-04-04 NOTE — Telephone Encounter (Signed)
All refills have been sent.  Request was just sent an hour ago while I was at lunch. I called patient and let him know this and he was thankful for the update.

## 2019-04-04 NOTE — Telephone Encounter (Signed)
Patient is calling because [facility] told him that the office is not responding to any of their medication refill request via electronic or faxes and also that the office is not replying to the phone calls or faxes in regards to the tier exception form. Please advise patient on a resolution with this

## 2019-04-26 DIAGNOSIS — Z0000 Encounter for general adult medical examination without abnormal findings: Secondary | ICD-10-CM | POA: Diagnosis not present

## 2019-05-15 DIAGNOSIS — L570 Actinic keratosis: Secondary | ICD-10-CM | POA: Diagnosis not present

## 2019-05-15 DIAGNOSIS — D485 Neoplasm of uncertain behavior of skin: Secondary | ICD-10-CM | POA: Diagnosis not present

## 2019-05-15 DIAGNOSIS — L578 Other skin changes due to chronic exposure to nonionizing radiation: Secondary | ICD-10-CM | POA: Diagnosis not present

## 2019-05-15 DIAGNOSIS — Z87891 Personal history of nicotine dependence: Secondary | ICD-10-CM | POA: Diagnosis not present

## 2019-05-15 DIAGNOSIS — C4441 Basal cell carcinoma of skin of scalp and neck: Secondary | ICD-10-CM | POA: Diagnosis not present

## 2019-05-15 DIAGNOSIS — L821 Other seborrheic keratosis: Secondary | ICD-10-CM | POA: Diagnosis not present

## 2019-05-16 ENCOUNTER — Other Ambulatory Visit: Payer: Self-pay | Admitting: Family Medicine

## 2019-05-18 DIAGNOSIS — Z8551 Personal history of malignant neoplasm of bladder: Secondary | ICD-10-CM | POA: Diagnosis not present

## 2019-05-18 DIAGNOSIS — Z0000 Encounter for general adult medical examination without abnormal findings: Secondary | ICD-10-CM | POA: Diagnosis not present

## 2019-05-21 DIAGNOSIS — Z8551 Personal history of malignant neoplasm of bladder: Secondary | ICD-10-CM | POA: Diagnosis not present

## 2019-05-21 DIAGNOSIS — Z0000 Encounter for general adult medical examination without abnormal findings: Secondary | ICD-10-CM | POA: Diagnosis not present

## 2019-06-03 ENCOUNTER — Other Ambulatory Visit: Payer: Self-pay | Admitting: Family Medicine

## 2019-08-08 IMAGING — MR MR HEAD WO/W CM
8 of 10 series · 34 of 48 positions shown · IV contrast (multihance)
Comparison: Cervical spine radiographs 05/03/2016, MRI 12/11/2015.
Head CT without contrast 05/20/2014.

CLINICAL DATA: 71-year-old male with persistent, progressive
episodic vertigo symptoms for 2 years. Chronic fatigue, weakness.
Personal history of bladder cancer.

EXAM:
MRI HEAD WITHOUT AND WITH CONTRAST
TECHNIQUE: Multiplanar, multiecho pulse sequences of the brain and surrounding
structures were obtained without and with intravenous contrast.
CONTRAST:  20mL MULTIHANCE GADOBENATE DIMEGLUMINE 529 MG/ML IV SOLN

[Series 3: T1 · sagittal · 5.0mm · 0.47mm/px · 2 of 25 slices shown]
[im 1/25]
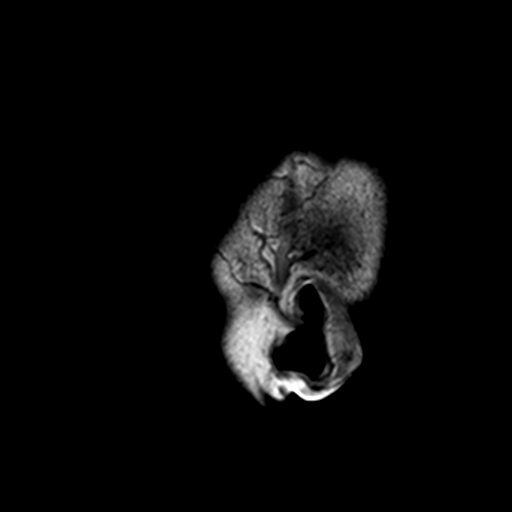
[im 25/25]
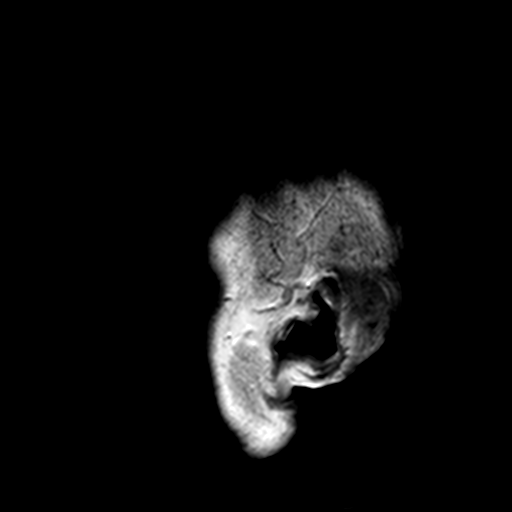

[Series 4: DWI · axial · 3.0mm · 2.19mm/px · z∈[-93,+68]mm · 11 of 100 slices shown (1 of 2)]
[im 1/100]
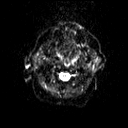
[im 10/100]
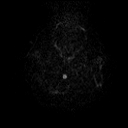
[im 20/100]
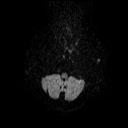
[im 30/100]
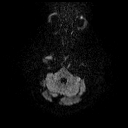
[im 40/100]
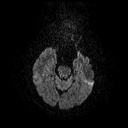
[im 50/100]
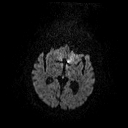
[im 60/100]
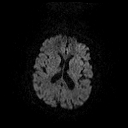
[im 70/100]
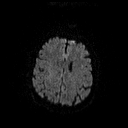
[im 80/100]
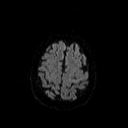
[im 90/100]
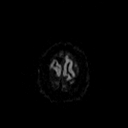
[im 100/100]
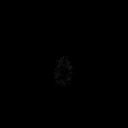

[Series 5: DWI · axial · 3.0mm · 2.19mm/px · z∈[-93,+68]mm · 6 of 50 slices shown (2 of 2)]
[im 1/50]
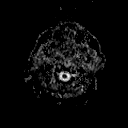
[im 10/50]
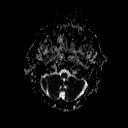
[im 20/50]
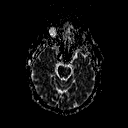
[im 30/50]
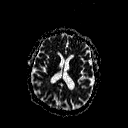
[im 40/50]
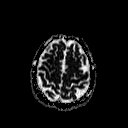
[im 50/50]
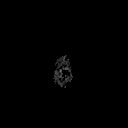

[Series 6: T2 · axial · 5.0mm · 0.47mm/px · z∈[-70,+101]mm · 3 of 26 slices shown (1 of 2)]
[im 1/26]
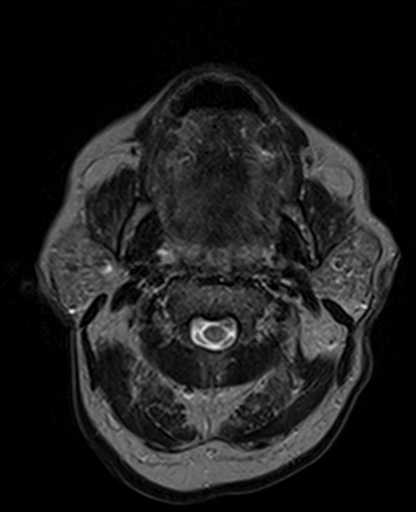
[im 13/26]
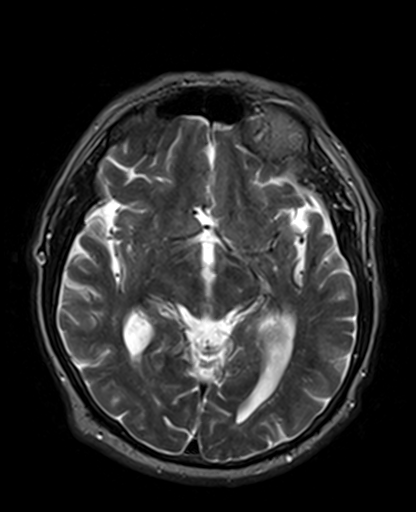
[im 26/26]
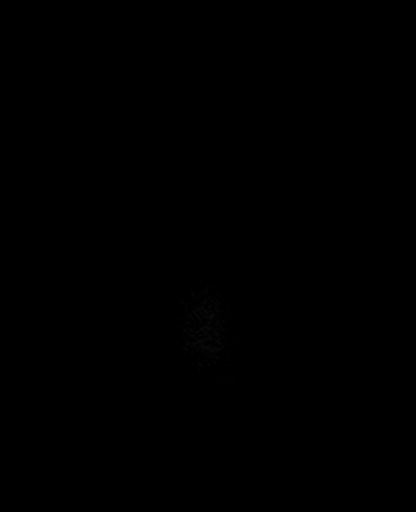

[Series 7: T2 · axial · 5.0mm · 0.47mm/px · z∈[-71,+101]mm · 3 of 26 slices shown (2 of 2)]
[im 1/26]
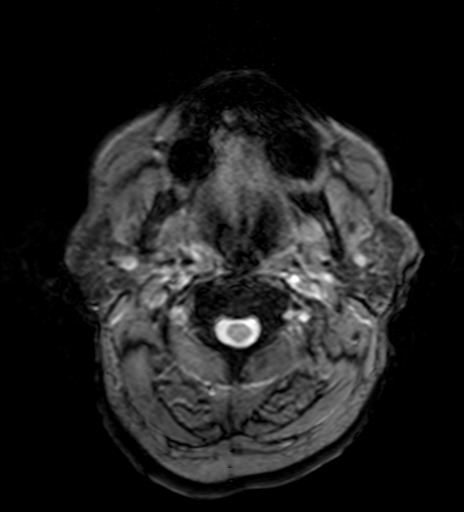
[im 13/26]
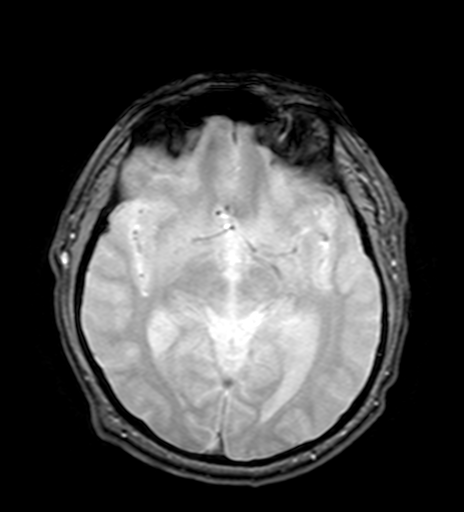
[im 26/26]
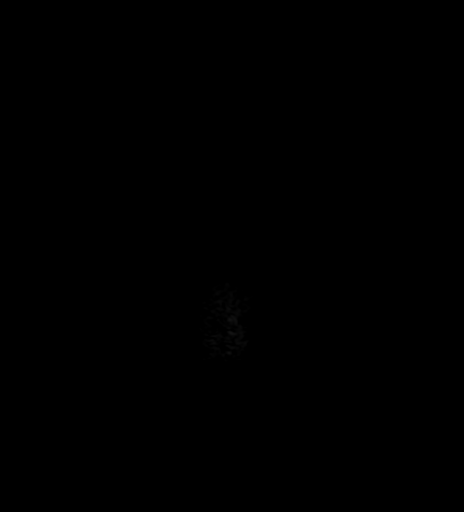

[Series 9: FLAIR · axial · 3.0mm · 0.47mm/px · z∈[-67,+92]mm · 3 of 28 slices shown]
[im 1/28]
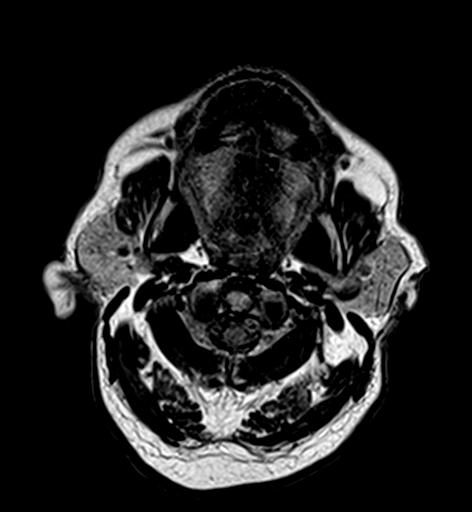
[im 14/28]
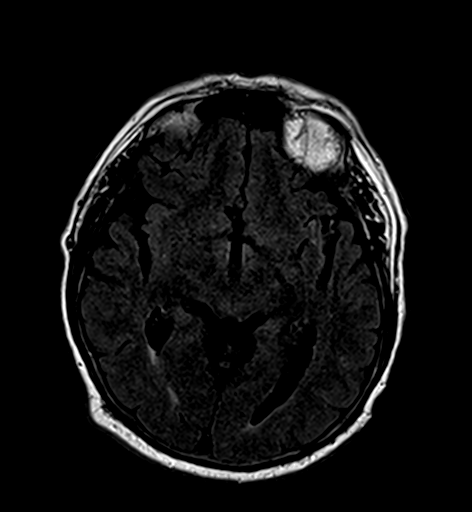
[im 28/28]
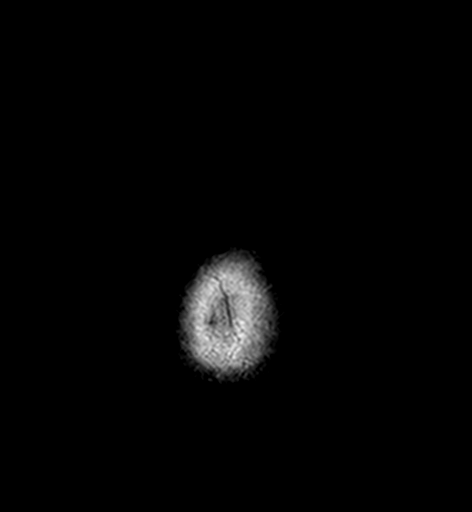

[Series 10: T2 post-contrast · coronal · 5.0mm · 0.45mm/px · 3 of 28 slices shown]
[im 1/28]
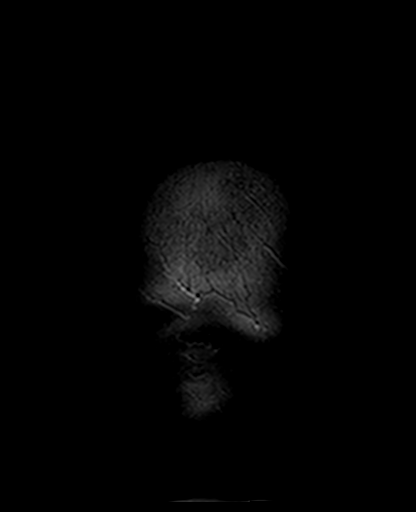
[im 14/28]
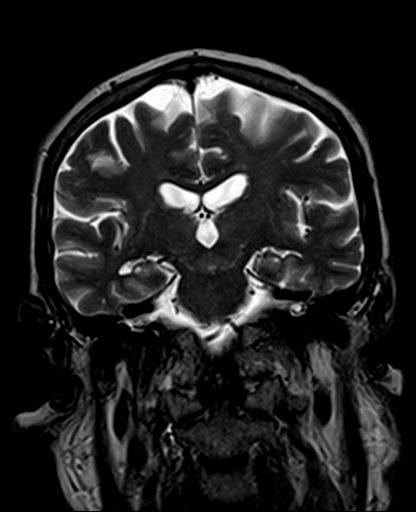
[im 28/28]
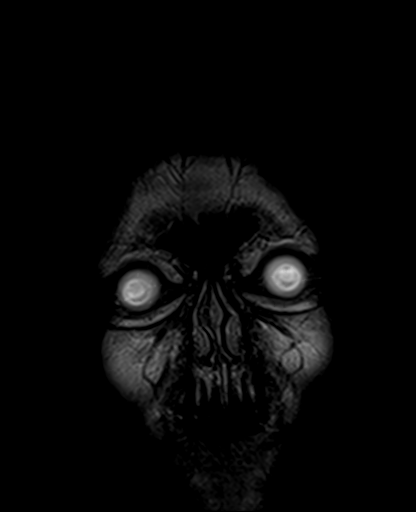

[Series 13: T1 post-contrast · coronal · 5.0mm · 0.45mm/px · 3 of 28 slices shown]
[im 1/28]
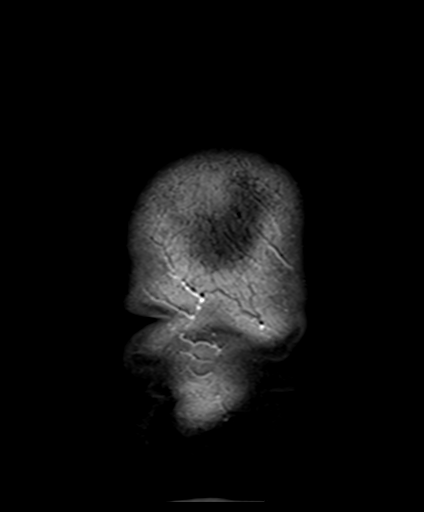
[im 14/28]
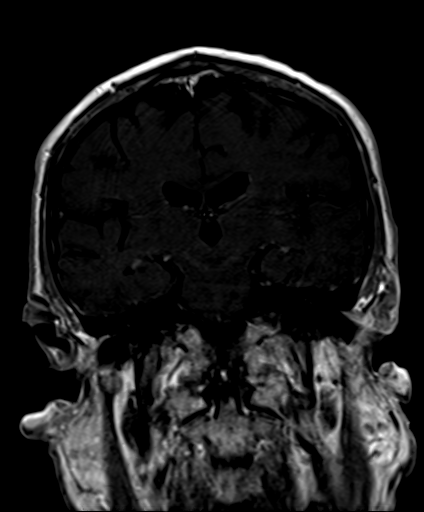
[im 28/28]
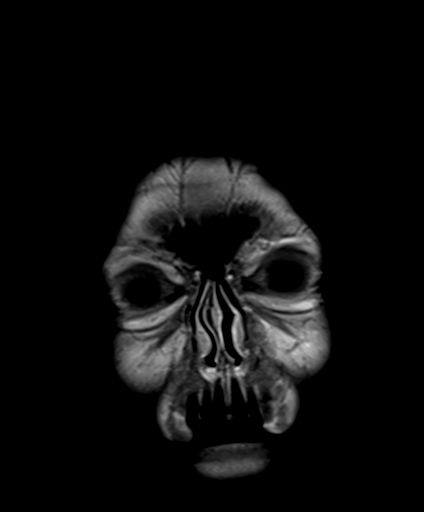

[34 of 48 positions shown; findings below may reference images not displayed]

FINDINGS: Brain: No restricted diffusion to suggest acute infarction. No
midline shift, mass effect, evidence of mass lesion,
ventriculomegaly, extra-axial collection or acute intracranial
hemorrhage. Cervicomedullary junction and pituitary are within
normal limits.

No abnormal intracranial enhancement. No dural thickening. Minimal
for age scattered nonspecific cerebral white matter T2 and FLAIR
hyperintensity (such as series 9, image 18). No cortical
encephalomalacia. No chronic cerebral blood products. The deep gray
matter nuclei, brainstem, and cerebellum appear normal.

Vascular: Major intracranial vascular flow voids are preserved. The
major dural venous sinuses are enhancing and appear patent.

Skull and upper cervical spine: Negative visible cervical spine.
Normal bone marrow signal.

Sinuses/Orbits: Postoperative changes to both globes. Otherwise
normal orbits soft tissues. Paranasal sinuses are clear.

Other: Mastoid air cells are clear. Visible internal auditory
structures appear normal. Scalp and face soft tissues appear
negative.
IMPRESSION: No metastatic disease or acute intracranial abnormality. Normal for
age MRI appearance of the brain.

## 2019-09-10 ENCOUNTER — Other Ambulatory Visit: Payer: Self-pay | Admitting: General Practice

## 2019-09-10 MED ORDER — MELOXICAM 15 MG PO TABS: 15.0000 mg | ORAL_TABLET | Freq: Every day | ORAL | 1 refills | Status: AC

## 2021-06-10 ENCOUNTER — Encounter: Payer: Self-pay | Admitting: *Deleted
